# Patient Record
Sex: Female | Born: 1955 | Race: White | Hispanic: No | Marital: Married | Smoking: Never smoker
Health system: Southern US, Community
[De-identification: ages and names within clinical notes are randomized; demographics above are authoritative.]

## PROBLEM LIST (undated history)

## (undated) DIAGNOSIS — M1990 Unspecified osteoarthritis, unspecified site: Secondary | ICD-10-CM

## (undated) DIAGNOSIS — IMO0001 Reserved for inherently not codable concepts without codable children: Secondary | ICD-10-CM

## (undated) DIAGNOSIS — F329 Major depressive disorder, single episode, unspecified: Secondary | ICD-10-CM

## (undated) DIAGNOSIS — T7840XA Allergy, unspecified, initial encounter: Secondary | ICD-10-CM

## (undated) DIAGNOSIS — F32A Depression, unspecified: Secondary | ICD-10-CM

## (undated) DIAGNOSIS — G8929 Other chronic pain: Secondary | ICD-10-CM

## (undated) DIAGNOSIS — G4700 Insomnia, unspecified: Secondary | ICD-10-CM

## (undated) DIAGNOSIS — K219 Gastro-esophageal reflux disease without esophagitis: Secondary | ICD-10-CM

## (undated) DIAGNOSIS — M549 Dorsalgia, unspecified: Secondary | ICD-10-CM

## (undated) HISTORY — DX: Reserved for inherently not codable concepts without codable children: IMO0001

## (undated) HISTORY — DX: Gastro-esophageal reflux disease without esophagitis: K21.9

## (undated) HISTORY — PX: NO PAST SURGERIES: SHX2092

## (undated) HISTORY — DX: Other chronic pain: G89.29

## (undated) HISTORY — DX: Depression, unspecified: F32.A

## (undated) HISTORY — DX: Allergy, unspecified, initial encounter: T78.40XA

## (undated) HISTORY — DX: Unspecified osteoarthritis, unspecified site: M19.90

## (undated) HISTORY — DX: Major depressive disorder, single episode, unspecified: F32.9

## (undated) HISTORY — DX: Insomnia, unspecified: G47.00

## (undated) HISTORY — DX: Other chronic pain: M54.9

---

## 2003-02-10 ENCOUNTER — Other Ambulatory Visit: Admission: RE | Admit: 2003-02-10 | Discharge: 2003-02-10 | Payer: Self-pay | Admitting: Obstetrics and Gynecology

## 2004-09-28 ENCOUNTER — Other Ambulatory Visit: Admission: RE | Admit: 2004-09-28 | Discharge: 2004-09-28 | Payer: Self-pay | Admitting: Obstetrics and Gynecology

## 2006-09-15 ENCOUNTER — Encounter: Admission: RE | Admit: 2006-09-15 | Discharge: 2006-09-15 | Payer: Self-pay

## 2010-11-14 ENCOUNTER — Other Ambulatory Visit: Payer: Self-pay | Admitting: Family Medicine

## 2010-11-15 ENCOUNTER — Ambulatory Visit (HOSPITAL_COMMUNITY)
Admission: RE | Admit: 2010-11-15 | Discharge: 2010-11-15 | Disposition: A | Discharge status: Home / Self Care | Payer: Managed Care, Other (non HMO) | Source: Ambulatory Visit | Attending: Family Medicine | Admitting: Family Medicine

## 2010-11-15 DIAGNOSIS — R109 Unspecified abdominal pain: Secondary | ICD-10-CM | POA: Insufficient documentation

## 2012-06-29 ENCOUNTER — Encounter: Payer: Self-pay | Admitting: *Deleted

## 2012-06-30 ENCOUNTER — Ambulatory Visit: Payer: Self-pay | Admitting: Family Medicine

## 2012-07-01 ENCOUNTER — Other Ambulatory Visit: Payer: Self-pay | Admitting: *Deleted

## 2012-07-01 MED ORDER — SERTRALINE HCL 50 MG PO TABS: 50.0000 mg | ORAL_TABLET | Freq: Every day | ORAL | Status: DC

## 2012-07-01 MED ORDER — BUSPIRONE HCL 10 MG PO TABS: 10.0000 mg | ORAL_TABLET | Freq: Two times a day (BID) | ORAL | Status: DC

## 2012-07-03 ENCOUNTER — Ambulatory Visit: Payer: Managed Care, Other (non HMO) | Admitting: Family Medicine

## 2012-07-09 ENCOUNTER — Ambulatory Visit (INDEPENDENT_AMBULATORY_CARE_PROVIDER_SITE_OTHER): Payer: Managed Care, Other (non HMO) | Admitting: Family Medicine

## 2012-07-09 ENCOUNTER — Encounter: Payer: Self-pay | Admitting: Family Medicine

## 2012-07-09 VITALS — BP 110/68 | HR 70 | Ht 67.0 in | Wt 197.6 lb

## 2012-07-09 DIAGNOSIS — F411 Generalized anxiety disorder: Secondary | ICD-10-CM

## 2012-07-09 DIAGNOSIS — K219 Gastro-esophageal reflux disease without esophagitis: Secondary | ICD-10-CM | POA: Insufficient documentation

## 2012-07-09 DIAGNOSIS — M549 Dorsalgia, unspecified: Secondary | ICD-10-CM | POA: Insufficient documentation

## 2012-07-09 DIAGNOSIS — F329 Major depressive disorder, single episode, unspecified: Secondary | ICD-10-CM

## 2012-07-09 DIAGNOSIS — G4700 Insomnia, unspecified: Secondary | ICD-10-CM

## 2012-07-09 DIAGNOSIS — G8929 Other chronic pain: Secondary | ICD-10-CM

## 2012-07-09 MED ORDER — BUSPIRONE HCL 10 MG PO TABS
10.0000 mg | ORAL_TABLET | Freq: Two times a day (BID) | ORAL | Status: DC
Start: 2012-07-09 — End: 2013-01-27

## 2012-07-09 MED ORDER — SERTRALINE HCL 50 MG PO TABS: 50.0000 mg | ORAL_TABLET | Freq: Every day | ORAL | Status: DC

## 2012-07-09 MED ORDER — PANTOPRAZOLE SODIUM 40 MG PO TBEC: 40.0000 mg | DELAYED_RELEASE_TABLET | Freq: Every day | ORAL | Status: DC

## 2012-07-09 MED ORDER — TRAZODONE HCL 100 MG PO TABS
100.0000 mg | ORAL_TABLET | Freq: Every day | ORAL | Status: DC
Start: 2012-07-09 — End: 2013-05-18

## 2012-07-09 NOTE — Progress Notes (Signed)
  Subjective:    Patient ID: Julie Campos, female    DOB: 1955/12/27, 57 y.o.   MRN: [id number]  Heartburn She complains of abdominal pain and heartburn. This is a recurrent problem. The current episode started more than 1 month ago. The problem has been unchanged. The heartburn duration is several minutes. The heartburn is of moderate intensity. The heartburn does not wake her from sleep. Nothing aggravates the symptoms. She has tried a PPI for the symptoms. The treatment provided moderate relief.   Sleeping better. Taking trazodone. Helping a lot. Exercise these days back to gym. On wwatcher. Started two [employer] Mood overall better this winter. Not as bad as in past. Still trouble sleeping on occasion. Recent dyspnea with anxiety. No chest pain  Review of Systems  Gastrointestinal: Positive for heartburn and abdominal pain.  All other systems reviewed and are negative.  takes two nights per weekfor insomnia. Chronic back pain better.     Objective:   Physical Exam Alert HEENT normal neck supple. Vitals noted. Lungs clear. Heart regular rate and rhythm. Abdomen benign. No significant tenderness.               Impression #1 reflux recent flare discussed. #2 insomnia ongoing medication helps. #3 chronic back pain noted and discuss.       Assessment & Plan:  Impression as noted above. Diet exercise discussed. Maintain same medications. Warning signs discussed. WSL easily 25 minutes spent most in discussion.

## 2012-07-09 NOTE — Patient Instructions (Signed)
Please continue to exercise regularly and watch her diet. Stay with all of the medicines.

## 2012-12-07 ENCOUNTER — Ambulatory Visit (INDEPENDENT_AMBULATORY_CARE_PROVIDER_SITE_OTHER): Payer: Managed Care, Other (non HMO) | Admitting: Family Medicine

## 2012-12-07 ENCOUNTER — Ambulatory Visit: Payer: Managed Care, Other (non HMO) | Admitting: Family Medicine

## 2012-12-07 ENCOUNTER — Encounter: Payer: Self-pay | Admitting: Family Medicine

## 2012-12-07 VITALS — BP 122/78 | Ht 67.0 in | Wt 204.6 lb

## 2012-12-07 DIAGNOSIS — K219 Gastro-esophageal reflux disease without esophagitis: Secondary | ICD-10-CM

## 2012-12-07 DIAGNOSIS — G4700 Insomnia, unspecified: Secondary | ICD-10-CM

## 2012-12-07 MED ORDER — SUCRALFATE 1 G PO TABS: 1.0000 g | ORAL_TABLET | Freq: Four times a day (QID) | ORAL | Status: DC

## 2012-12-07 MED ORDER — PANTOPRAZOLE SODIUM 40 MG PO TBEC: 40.0000 mg | DELAYED_RELEASE_TABLET | Freq: Every day | ORAL | Status: DC

## 2012-12-07 MED ORDER — ONDANSETRON 4 MG PO TBDP: 4.0000 mg | ORAL_TABLET | Freq: Three times a day (TID) | ORAL | Status: DC | PRN

## 2012-12-07 NOTE — Progress Notes (Signed)
  Subjective:    Patient ID: Julie Campos, female    DOB: 1955-06-30, 57 y.o.   MRN: [id number]  HPI Patient arrives with complaint of nausea after eating for a few weeks. Currently on Protonix but not taking it on a regular basis.  Having some ongoing issues with poor sleep, sig stress with aunt in the hospital. Aunt had lung ca, had hx of pulm fibrosis.  Reflux got really bad a few weeks ago, switched to bland foods,   Vomited last night.   Review of Systems No cough no chest pain no major abdominal pain ROS otherwise negative    Objective:   Physical Exam  Alert no acute distress. Lungs clear. Heart regular rate and rhythm. Abdomen slight epigastric tenderness no rebound no guarding no CVA tenderness      Assessment & Plan:  Impression flare of reflux discussed #2 insomnia clinically stable. #3 depression some worsening lately with increased stress. Plan Carafate a.c. and at bedtime. Initiate protonic. Maintain other medications. Diet exercise discussed. Followup in 6 months. WSL

## 2012-12-09 ENCOUNTER — Ambulatory Visit: Payer: Managed Care, Other (non HMO) | Admitting: Family Medicine

## 2013-01-08 ENCOUNTER — Other Ambulatory Visit: Payer: Self-pay | Admitting: Family Medicine

## 2013-01-08 ENCOUNTER — Other Ambulatory Visit: Payer: Self-pay | Admitting: *Deleted

## 2013-01-08 MED ORDER — CLONAZEPAM 0.5 MG PO TABS: 0.5000 mg | ORAL_TABLET | Freq: Every evening | ORAL | Status: DC | PRN

## 2013-01-08 NOTE — Telephone Encounter (Signed)
Ok plus 2 ref 

## 2013-01-22 ENCOUNTER — Telehealth: Payer: Self-pay | Admitting: Family Medicine

## 2013-01-22 ENCOUNTER — Other Ambulatory Visit: Payer: Self-pay | Admitting: Nurse Practitioner

## 2013-01-22 MED ORDER — ONDANSETRON 4 MG PO TBDP: 4.0000 mg | ORAL_TABLET | Freq: Three times a day (TID) | ORAL | Status: DC | PRN

## 2013-01-22 NOTE — Telephone Encounter (Signed)
Patient would like Rx for ondansetron for nausea. She is out of town.   [facility] [place] [phone]

## 2013-01-27 ENCOUNTER — Other Ambulatory Visit: Payer: Self-pay | Admitting: Family Medicine

## 2013-02-24 ENCOUNTER — Other Ambulatory Visit: Payer: Self-pay | Admitting: Family Medicine

## 2013-05-18 ENCOUNTER — Encounter: Payer: Self-pay | Admitting: Family Medicine

## 2013-05-18 ENCOUNTER — Ambulatory Visit (INDEPENDENT_AMBULATORY_CARE_PROVIDER_SITE_OTHER): Payer: BC Managed Care – PPO | Admitting: Family Medicine

## 2013-05-18 VITALS — BP 114/76 | Ht 66.0 in | Wt 214.0 lb

## 2013-05-18 DIAGNOSIS — F329 Major depressive disorder, single episode, unspecified: Secondary | ICD-10-CM

## 2013-05-18 DIAGNOSIS — M549 Dorsalgia, unspecified: Secondary | ICD-10-CM

## 2013-05-18 DIAGNOSIS — F32A Depression, unspecified: Secondary | ICD-10-CM

## 2013-05-18 DIAGNOSIS — K219 Gastro-esophageal reflux disease without esophagitis: Secondary | ICD-10-CM

## 2013-05-18 DIAGNOSIS — G4700 Insomnia, unspecified: Secondary | ICD-10-CM

## 2013-05-18 DIAGNOSIS — G8929 Other chronic pain: Secondary | ICD-10-CM

## 2013-05-18 DIAGNOSIS — F3289 Other specified depressive episodes: Secondary | ICD-10-CM

## 2013-05-18 MED ORDER — BUSPIRONE HCL 10 MG PO TABS: ORAL_TABLET | ORAL | Status: DC

## 2013-05-18 MED ORDER — PANTOPRAZOLE SODIUM 40 MG PO TBEC: 40.0000 mg | DELAYED_RELEASE_TABLET | Freq: Every day | ORAL | Status: DC

## 2013-05-18 MED ORDER — CLONAZEPAM 0.5 MG PO TABS: 0.5000 mg | ORAL_TABLET | Freq: Every evening | ORAL | Status: DC | PRN

## 2013-05-18 MED ORDER — TRAZODONE HCL 100 MG PO TABS: 100.0000 mg | ORAL_TABLET | Freq: Every day | ORAL | Status: DC

## 2013-05-18 MED ORDER — SERTRALINE HCL 50 MG PO TABS: ORAL_TABLET | ORAL | Status: DC

## 2013-05-18 MED ORDER — ONDANSETRON 4 MG PO TBDP: 4.0000 mg | ORAL_TABLET | Freq: Four times a day (QID) | ORAL | Status: DC | PRN

## 2013-05-18 NOTE — Progress Notes (Signed)
   Subjective:    Patient ID: Julie Campos, female    DOB: 1955/05/20, 58 y.o.   MRN: [id number]  HPI Patient is here today for a medication check up.   Staying busy with Julie Campos  hearburn itself better. Some nausea hits two or three times per wk. No certain foods. Can last a couple of hours. Hard to take on empty stomach with the protonix  She states that she is still having trouble with nausea at times. No vomiting noted.   Overall mood better, no sig problem  Had a chalenge with anxiety. Ongoing challenge also with insomnia. Review of Systems No headache no chest pain no back pain no change in bowel habits no blood in stool    Objective:   Physical Exam Alert mild malaise. HEENT normal. Lungs clear. Heart regular in rhythm. Abdomen no particular tenderness. No rebound no guarding       Assessment & Plan:  Impression 1 reflux with possible element of gastritis. Also significant nausea #2 insomnia ongoing. #3 depression and anxiety ongoing but clinically stable plan add Zofran once again.Marland Kitchen. Resume protonic for regularly. Diet exercise discussed in encourage. Maintain other medications. Recheck as scheduled. WSL

## 2013-05-24 ENCOUNTER — Other Ambulatory Visit: Payer: Self-pay | Admitting: Family Medicine

## 2013-08-03 ENCOUNTER — Ambulatory Visit: Payer: BC Managed Care – PPO | Admitting: Family Medicine

## 2013-08-09 ENCOUNTER — Encounter: Payer: Self-pay | Admitting: Nurse Practitioner

## 2013-08-09 ENCOUNTER — Ambulatory Visit (INDEPENDENT_AMBULATORY_CARE_PROVIDER_SITE_OTHER): Payer: BC Managed Care – PPO | Admitting: Nurse Practitioner

## 2013-08-09 VITALS — BP 128/82 | Ht 66.0 in | Wt 218.2 lb

## 2013-08-09 DIAGNOSIS — M545 Low back pain, unspecified: Secondary | ICD-10-CM

## 2013-08-09 MED ORDER — METHOCARBAMOL 750 MG PO TABS: 750.0000 mg | ORAL_TABLET | Freq: Three times a day (TID) | ORAL | Status: DC | PRN

## 2013-08-09 MED ORDER — PREDNISONE 20 MG PO TABS: ORAL_TABLET | ORAL | Status: DC

## 2013-08-09 MED ORDER — HYDROCODONE-ACETAMINOPHEN 5-325 MG PO TABS: 1.0000 | ORAL_TABLET | ORAL | Status: DC | PRN

## 2013-08-09 NOTE — Patient Instructions (Signed)
Ice or heat applications Restart TENS unit Restart lidocaine patches

## 2013-08-11 ENCOUNTER — Encounter: Payer: Self-pay | Admitting: Nurse Practitioner

## 2013-08-11 NOTE — Progress Notes (Signed)
Subjective:  Presents for c/o low back pain going into right thigh. Began after getting out of the shower. Felt a "catch" in her back. No change in bowel or bladder habits. History of "bulging disc". No weakness or numbness of right leg. Has lidocaine patches at home which have helped in the past; has not tried them. Has gained some weight. Also has a history of chronic insomnia which has added to her fatigue.  Objective:   BP 128/82  Ht 5\' 6"  (1.676 m)  Wt 218 lb 3.2 oz (98.975 kg)  BMI 35.24 kg/m2 NAD. Alert, oriented. Lungs clear. Heart RRR. Mild tenderness and muscle tightness noted in right low back area. SLR neg bilat. Reflexes nl. Gait slow but steady.  Assessment: Low back pain radiating to right leg  Plan:  Meds ordered this encounter  Medications  . lidocaine (LIDODERM) 5 %    Sig:   . predniSONE (DELTASONE) 20 MG tablet    Sig: 3 po qd x 3 d then 2 po qd x 3 d then 1 po qd x 3 d    Dispense:  18 tablet    Refill:  0    Order Specific Question:  Supervising Provider    Answer:  Merlyn AlbertLUKING, WILLIAM S [[id number]]  . methocarbamol (ROBAXIN) 750 MG tablet    Sig: Take 1 tablet (750 mg total) by mouth every 8 (eight) hours as needed for muscle spasms.    Dispense:  30 tablet    Refill:  0    Order Specific Question:  Supervising Provider    Answer:  Merlyn AlbertLUKING, WILLIAM S [[id number]]  . HYDROcodone-acetaminophen (NORCO/VICODIN) 5-325 MG per tablet    Sig: Take 1 tablet by mouth every 4 (four) hours as needed.    Dispense:  30 tablet    Refill:  0    Order Specific Question:  Supervising Provider    Answer:  Merlyn AlbertLUKING, WILLIAM S [[id number]]   Ice or heat applications Restart TENS unit Restart lidocaine patches Back exercises. Call back in 7-10 days if no improvement, sooner if worse.

## 2013-09-14 ENCOUNTER — Other Ambulatory Visit: Payer: Self-pay | Admitting: Family Medicine

## 2013-12-15 ENCOUNTER — Other Ambulatory Visit: Payer: Self-pay | Admitting: Family Medicine

## 2013-12-16 NOTE — Telephone Encounter (Signed)
Ok times one contact pt due six mo chronic prob visit

## 2014-01-14 ENCOUNTER — Other Ambulatory Visit: Payer: Self-pay | Admitting: Family Medicine

## 2014-04-20 ENCOUNTER — Ambulatory Visit (INDEPENDENT_AMBULATORY_CARE_PROVIDER_SITE_OTHER): Payer: BLUE CROSS/BLUE SHIELD | Admitting: Family Medicine

## 2014-04-20 ENCOUNTER — Encounter: Payer: Self-pay | Admitting: Family Medicine

## 2014-04-20 VITALS — Ht 65.0 in | Wt 210.8 lb

## 2014-04-20 DIAGNOSIS — R1011 Right upper quadrant pain: Secondary | ICD-10-CM

## 2014-04-20 MED ORDER — BUSPIRONE HCL 10 MG PO TABS: 10.0000 mg | ORAL_TABLET | Freq: Two times a day (BID) | ORAL | Status: DC

## 2014-04-20 MED ORDER — SERTRALINE HCL 50 MG PO TABS: 50.0000 mg | ORAL_TABLET | Freq: Every day | ORAL | Status: DC

## 2014-04-20 MED ORDER — PANTOPRAZOLE SODIUM 40 MG PO TBEC: 40.0000 mg | DELAYED_RELEASE_TABLET | Freq: Every day | ORAL | Status: DC

## 2014-04-20 MED ORDER — CLONAZEPAM 0.5 MG PO TABS: ORAL_TABLET | ORAL | Status: DC

## 2014-04-20 MED ORDER — SUCRALFATE 1 G PO TABS: 1.0000 g | ORAL_TABLET | Freq: Four times a day (QID) | ORAL | Status: DC

## 2014-04-20 NOTE — Progress Notes (Signed)
   Subjective:    Patient ID: Julie Campos, female    DOB: 09-Jul-1955, 59 y.o.   MRN: [id number]  HPI Patient arrives with problems with upset stomach and heartburn for few weeks-will vomit several times a week.  Uses the protonix more as needed   Still has gallbladder  Morning nausea not a problem  Steaks etc. Often trigger nause and then vomiting  Received bad news regarding loved one last wk and developed nausea  Gets reflux four to five times per wk  Patient arrives office with numerous concerns. History of depression. Clinically stable on medications.  Also history of substantial insomnia. States definitely needs medication to help her in this regard.  Continues to have substantial nausea. Often postprandial. Also reflux is a great challenge. On further history not taking the Protonix regularly. Has backed off to over-the-counter ranitidine.   Review of Systems No chest pain no headache no shortness of breath some upper abdominal pain definite reflux symptoms no chills. No true dysphagia    Objective:   Physical Exam Alert no acute distress. Vitals stable. Lungs clear. Heart regular in rhythm H&T normal. Epigastrium mild tenderness to palpation. No rebound no guarding no significant right upper quadrant tenderness       Assessment & Plan:  Impression 1 insomnia stable on meds #2 depression clinically stable. #3 reflux. #4 probable element of gastritis. #5 intermittent substantial nausea plan right upper quadrant ultrasound. Rationale discussed. Maintain other medications. Resume Protonix. Add Carafate. Warning signs discussed. Follow-up as scheduled. WSL

## 2014-04-22 ENCOUNTER — Ambulatory Visit (HOSPITAL_COMMUNITY)
Admission: RE | Admit: 2014-04-22 | Discharge: 2014-04-22 | Disposition: A | Discharge status: Home / Self Care | Payer: BLUE CROSS/BLUE SHIELD | Source: Ambulatory Visit | Attending: Family Medicine | Admitting: Family Medicine

## 2014-04-22 DIAGNOSIS — R1011 Right upper quadrant pain: Secondary | ICD-10-CM | POA: Diagnosis not present

## 2014-04-22 DIAGNOSIS — R110 Nausea: Secondary | ICD-10-CM | POA: Diagnosis not present

## 2014-04-27 ENCOUNTER — Telehealth: Payer: Self-pay | Admitting: Family Medicine

## 2014-04-27 MED ORDER — ONDANSETRON 8 MG PO TBDP: 8.0000 mg | ORAL_TABLET | Freq: Three times a day (TID) | ORAL | Status: DC | PRN

## 2014-04-27 NOTE — Telephone Encounter (Signed)
Per Dr. Lorin PicketScott- Notified patient that we will send in Zofran 8 mg ODT #30 1 tab po TID PRN. Acid reflux medications can take up to 2 weeks to see results, so continue to take omeprazole. If no better in 2 weeks, follow up with Dr. Brett CanalesSteve for further testing. Patient verbalized understanding.

## 2014-04-27 NOTE — Telephone Encounter (Signed)
Pt calling to say that she would like some nausea meds to take due to her  Still having nausea with her heartburn. She had a gallbladder done last Friday That came back negative so she thinks it is just reflux at this point. Is there anything  else she can take a this time for the reflux?   She is not having any lower abd pain with this its all in her upper and esophagus at this point  [facility] [place]

## 2014-05-07 ENCOUNTER — Other Ambulatory Visit: Payer: Self-pay | Admitting: Family Medicine

## 2014-06-06 ENCOUNTER — Other Ambulatory Visit: Payer: Self-pay | Admitting: Family Medicine

## 2014-06-30 ENCOUNTER — Other Ambulatory Visit: Payer: Self-pay | Admitting: Family Medicine

## 2014-08-08 ENCOUNTER — Ambulatory Visit (INDEPENDENT_AMBULATORY_CARE_PROVIDER_SITE_OTHER): Payer: BLUE CROSS/BLUE SHIELD | Admitting: Family Medicine

## 2014-08-08 ENCOUNTER — Encounter: Payer: Self-pay | Admitting: Family Medicine

## 2014-08-08 ENCOUNTER — Ambulatory Visit (HOSPITAL_COMMUNITY)
Admission: RE | Admit: 2014-08-08 | Discharge: 2014-08-08 | Disposition: A | Discharge status: Home / Self Care | Payer: BLUE CROSS/BLUE SHIELD | Source: Ambulatory Visit | Attending: Family Medicine | Admitting: Family Medicine

## 2014-08-08 VITALS — BP 122/82 | Ht 65.0 in | Wt 215.5 lb

## 2014-08-08 DIAGNOSIS — R05 Cough: Secondary | ICD-10-CM | POA: Diagnosis present

## 2014-08-08 DIAGNOSIS — R5383 Other fatigue: Secondary | ICD-10-CM | POA: Diagnosis not present

## 2014-08-08 DIAGNOSIS — Z79899 Other long term (current) drug therapy: Secondary | ICD-10-CM

## 2014-08-08 DIAGNOSIS — K219 Gastro-esophageal reflux disease without esophagitis: Secondary | ICD-10-CM | POA: Diagnosis not present

## 2014-08-08 DIAGNOSIS — E785 Hyperlipidemia, unspecified: Secondary | ICD-10-CM

## 2014-08-08 DIAGNOSIS — R059 Cough, unspecified: Secondary | ICD-10-CM

## 2014-08-08 LAB — POCT HEMOGLOBIN: HEMOGLOBIN: 13.3 g/dL (ref 12.2–16.2)

## 2014-08-08 MED ORDER — AZITHROMYCIN 250 MG PO TABS: ORAL_TABLET | ORAL | Status: DC

## 2014-08-08 NOTE — Addendum Note (Signed)
Addended by: Margaretha SheffieldBROWN, Raykwon Hobbs S on: 08/08/2014 06:12 PM   Modules accepted: Orders

## 2014-08-08 NOTE — Progress Notes (Signed)
   Subjective:    Patient ID: Julie Campos, female    DOB: 1955/09/08, 59 y.o.   MRN: [id number]  HPI  Patient arrives with complaint of fatigue- went to give blood recently and was told iron was low. Patient started otc iron tablets.  Results for orders placed or performed in visit on 08/08/14  POCT hemoglobin  Result Value Ref Range   Hemoglobin 13.3 12.2 - 16.2 g/dL  taking two multivitr with iron  Separate tablet.'  Sticking with iron tabs daily  No energy   Legs feel pretty weak, tiredness Tiredness has come on for six weeks or so   Two and three d per wk exrcise lliptical and ex bike and weights  Then hurt the shoulder, less often with gym  Pt's cousing pulm fibrosis, waiting on a lung transplant  Working six to eight hours per wk ish   Stomach overall doing well  Overall depr is doing good. Compliant with medications.   Overall reflux is improved. Compliant with Protonix. Recently stopped the Carafate.  Review of Systems    no headache no chest pain no back pain subacute cough past 6 weeks. This raises patient's anxiety regarding pulmonary fibrosis in the family no change in bowel habits Objective:   Physical Exam  Alert vital stable HEENT slight nasal congestion frontal neck supple lungs clear heart regular in rhythm thyroid nonpalpable  Hemoglobin decent      Assessment & Plan:  Impression fatigue protracted etiology unclear #2 reflux clinically improved #3 depression clinically stable #4 subacute cough with worry about pulmonary fibrosis plan appropriate blood work. Diet and exercise discussed and strongly encourage. Maintain same medications. Try to wean off protonic so one point. Further recommendations based on blood work [facility]SL

## 2014-08-09 LAB — CBC WITH DIFFERENTIAL/PLATELET
BASOS: 1 %
Basophils Absolute: 0 10*3/uL (ref 0.0–0.2)
EOS (ABSOLUTE): 0.1 10*3/uL (ref 0.0–0.4)
Eos: 3 %
HEMATOCRIT: 39.7 % (ref 34.0–46.6)
Hemoglobin: 13.3 g/dL (ref 11.1–15.9)
IMMATURE GRANULOCYTES: 0 %
Immature Grans (Abs): 0 10*3/uL (ref 0.0–0.1)
Lymphocytes Absolute: 1.8 10*3/uL (ref 0.7–3.1)
Lymphs: 34 %
MCH: 31 pg (ref 26.6–33.0)
MCHC: 33.5 g/dL (ref 31.5–35.7)
MCV: 93 fL (ref 79–97)
MONOCYTES: 9 %
Monocytes Absolute: 0.5 10*3/uL (ref 0.1–0.9)
NEUTROS PCT: 53 %
Neutrophils Absolute: 2.8 10*3/uL (ref 1.4–7.0)
Platelets: 336 10*3/uL (ref 150–379)
RBC: 4.29 x10E6/uL (ref 3.77–5.28)
RDW: 13.1 % (ref 12.3–15.4)
WBC: 5.2 10*3/uL (ref 3.4–10.8)

## 2014-08-09 LAB — BASIC METABOLIC PANEL
BUN/Creatinine Ratio: 15 (ref 9–23)
BUN: 11 mg/dL (ref 6–24)
CALCIUM: 9.3 mg/dL (ref 8.7–10.2)
CO2: 24 mmol/L (ref 18–29)
Chloride: 100 mmol/L (ref 97–108)
Creatinine, Ser: 0.72 mg/dL (ref 0.57–1.00)
GFR, EST AFRICAN AMERICAN: 108 mL/min/{1.73_m2} (ref >59)
GFR, EST NON AFRICAN AMERICAN: 93 mL/min/{1.73_m2} (ref >59)
Glucose: 94 mg/dL (ref 65–99)
Potassium: 4.4 mmol/L (ref 3.5–5.2)
SODIUM: 142 mmol/L (ref 134–144)

## 2014-08-09 LAB — LIPID PANEL
CHOLESTEROL TOTAL: 219 mg/dL — AB (ref 100–199)
Chol/HDL Ratio: 4.4 ratio units (ref 0.0–4.4)
HDL: 50 mg/dL (ref >39)
LDL Calculated: 152 mg/dL — ABNORMAL HIGH (ref 0–99)
Triglycerides: 87 mg/dL (ref 0–149)
VLDL Cholesterol Cal: 17 mg/dL (ref 5–40)

## 2014-08-09 LAB — TSH: TSH: 4.3 u[IU]/mL (ref 0.450–4.500)

## 2014-08-09 LAB — HEPATIC FUNCTION PANEL
ALBUMIN: 4.2 g/dL (ref 3.5–5.5)
ALT: 19 IU/L (ref 0–32)
AST: 21 IU/L (ref 0–40)
Alkaline Phosphatase: 110 IU/L (ref 39–117)
BILIRUBIN TOTAL: 0.4 mg/dL (ref 0.0–1.2)
BILIRUBIN, DIRECT: 0.09 mg/dL (ref 0.00–0.40)
Total Protein: 6.9 g/dL (ref 6.0–8.5)

## 2014-08-12 ENCOUNTER — Telehealth: Payer: Self-pay | Admitting: Family Medicine

## 2014-08-12 ENCOUNTER — Other Ambulatory Visit: Payer: Self-pay | Admitting: *Deleted

## 2014-08-12 MED ORDER — CEFDINIR 300 MG PO CAPS: 300.0000 mg | ORAL_CAPSULE | Freq: Two times a day (BID) | ORAL | Status: DC

## 2014-08-12 NOTE — Telephone Encounter (Signed)
Pt is asking for her lab results, she also wants another round  Of antibiotics says she is not better but just finished her zpak today Still feeling really tired as well   She also wants to know if allegra instead of the claritin you suggest  Is just as good?    [facility] [place] [address]

## 2014-08-12 NOTE — Telephone Encounter (Signed)
See lab results, can use the allegra that is fine

## 2014-08-12 NOTE — Telephone Encounter (Signed)
Discussed with pt

## 2014-08-12 NOTE — Telephone Encounter (Signed)
Seen 4/25 finished zpack. No wheezing but having to take deep breaths, no fever, fatigue, legs feels tired and weak, coughing up clear mucus. Taking allerga but can't remember if you told her to take clairtin instead. Also wanting results of bw.

## 2014-09-05 ENCOUNTER — Other Ambulatory Visit: Payer: Self-pay | Admitting: Family Medicine

## 2014-09-05 MED ORDER — CEFDINIR 300 MG PO CAPS: 300.0000 mg | ORAL_CAPSULE | Freq: Two times a day (BID) | ORAL | Status: DC

## 2014-09-05 NOTE — Telephone Encounter (Signed)
Med sent to pharmacy. Left message on voicemail notifying patient.  

## 2014-09-05 NOTE — Telephone Encounter (Signed)
Ok times one 

## 2014-09-05 NOTE — Telephone Encounter (Signed)
Patient needs a refill on omnicef 300 mg capsules having flare- up with coughing,congestion cant sleep at night due to coughing so much. [facility]  [place] [address]

## 2014-09-06 ENCOUNTER — Other Ambulatory Visit: Payer: Self-pay | Admitting: Family Medicine

## 2014-09-13 ENCOUNTER — Telehealth: Payer: Self-pay | Admitting: Family Medicine

## 2014-09-13 DIAGNOSIS — R059 Cough, unspecified: Secondary | ICD-10-CM

## 2014-09-13 DIAGNOSIS — R05 Cough: Secondary | ICD-10-CM

## 2014-09-13 DIAGNOSIS — R9389 Abnormal findings on diagnostic imaging of other specified body structures: Secondary | ICD-10-CM

## 2014-09-13 NOTE — Telephone Encounter (Signed)
Pt called stating that her bronchitis isn't getting any better and wants to know if it is normal or if she needs a different medication called in.   [facility] [place] [address]

## 2014-09-13 NOTE — Telephone Encounter (Signed)
Given z pack 08/08/2014

## 2014-09-13 NOTE — Telephone Encounter (Signed)
omnicef 300 bid tendays, rec to do ch xray when this is finished (originally recommended four wks ago to do around now)

## 2014-09-14 ENCOUNTER — Ambulatory Visit (HOSPITAL_COMMUNITY)
Admission: RE | Admit: 2014-09-14 | Discharge: 2014-09-14 | Disposition: A | Discharge status: Home / Self Care | Payer: BLUE CROSS/BLUE SHIELD | Source: Ambulatory Visit | Attending: Family Medicine | Admitting: Family Medicine

## 2014-09-14 DIAGNOSIS — R0602 Shortness of breath: Secondary | ICD-10-CM | POA: Diagnosis not present

## 2014-09-14 DIAGNOSIS — R079 Chest pain, unspecified: Secondary | ICD-10-CM | POA: Diagnosis not present

## 2014-09-14 DIAGNOSIS — R05 Cough: Secondary | ICD-10-CM | POA: Diagnosis present

## 2014-09-14 MED ORDER — CEFDINIR 300 MG PO CAPS: 300.0000 mg | ORAL_CAPSULE | Freq: Two times a day (BID) | ORAL | Status: DC

## 2014-09-14 NOTE — Telephone Encounter (Signed)
Rx sent electronically to pharmacy. CXR ordered in EPIC. Discussed with patient. Patient verbalized understanding.

## 2014-09-16 ENCOUNTER — Telehealth: Payer: Self-pay | Admitting: Family Medicine

## 2014-09-16 NOTE — Telephone Encounter (Signed)
Chest xray stable; no problems noted with heart or lungs

## 2014-09-16 NOTE — Telephone Encounter (Signed)
Results discussed with patient. Patient advised Chest xray stable; no problems noted with heart or lungs. Patient verbalized understanding.

## 2014-09-16 NOTE — Telephone Encounter (Signed)
Pt called wanting chest x-ray results, tried to explain that the results were sent to Dr. Brett CanalesSteve and that he has not signed off on them and that he would be back in the office on Monday.  Pt very anxious and worried, almost to tears, would like a call from us regarding the results as soon as possible, please do not mail a card.

## 2014-09-19 NOTE — Addendum Note (Signed)
Addended by: Metro KungICHARDS, WENDY M on: 09/19/2014 04:32 PM   Modules accepted: Orders

## 2014-09-21 ENCOUNTER — Encounter: Payer: Self-pay | Admitting: Internal Medicine

## 2014-09-21 ENCOUNTER — Encounter (INDEPENDENT_AMBULATORY_CARE_PROVIDER_SITE_OTHER): Payer: Self-pay

## 2014-09-21 ENCOUNTER — Ambulatory Visit (INDEPENDENT_AMBULATORY_CARE_PROVIDER_SITE_OTHER): Payer: BLUE CROSS/BLUE SHIELD | Admitting: Internal Medicine

## 2014-09-21 VITALS — BP 118/80 | HR 87 | Ht 66.0 in | Wt 217.4 lb

## 2014-09-21 DIAGNOSIS — R0600 Dyspnea, unspecified: Secondary | ICD-10-CM | POA: Diagnosis not present

## 2014-09-21 DIAGNOSIS — J8410 Pulmonary fibrosis, unspecified: Secondary | ICD-10-CM

## 2014-09-21 DIAGNOSIS — R9389 Abnormal findings on diagnostic imaging of other specified body structures: Secondary | ICD-10-CM | POA: Insufficient documentation

## 2014-09-21 MED ORDER — PANTOPRAZOLE SODIUM 40 MG PO TBEC: 40.0000 mg | DELAYED_RELEASE_TABLET | Freq: Every day | ORAL | Status: DC

## 2014-09-21 MED ORDER — FAMOTIDINE 20 MG PO TABS: ORAL_TABLET | ORAL | Status: DC

## 2014-09-21 NOTE — Patient Instructions (Addendum)
Pantoprazole (protonix) 40 mg   Take  30-60 min before first meal of the day and Pepcid (famotidine)  20 mg one @  bedtime until return to office - this is the best way to tell whether stomach acid is contributing to your problem.    GERD (REFLUX)  is an extremely common cause of respiratory symptoms just like yours , many times with no obvious heartburn at all.    It can be treated with medication, but also with lifestyle changes including avoidance of late meals, elevation of the head of your bed (ideally with 6 inch  bed blocks) excessive alcohol, smoking cessation, and avoid fatty foods, chocolate, peppermint, colas, red wine, and acidic juices such as orange juice.  NO MINT OR MENTHOL PRODUCTS SO NO COUGH DROPS  USE SUGARLESS CANDY INSTEAD (Jolley ranchers or Stover's or Life Savers) or even ice chips will also do - the key is to swallow to prevent all throat clearing. NO OIL BASED VITAMINS - use powdered substitutes.  Please schedule a follow up office visit in 6 weeks, call sooner if needed pfts  Add HRCT rec due to desats

## 2014-09-21 NOTE — Assessment & Plan Note (Addendum)
-    09/21/2014  Walked RA x 1 laps @ 185 ft each stopped due to 77% at nl pace and no sob   desat with increase int markings in pt with fm hx of PF > most likely this if PF ? Post inflammatory vs early UIP - see PF

## 2014-09-21 NOTE — Assessment & Plan Note (Signed)
DDx for pulmonary fibrosis  includes idiopathic pulmonary fibrosis, pulmonary fibrosis associated with rheumatologic diseases (which have a relatively benign course in most cases) , adverse effect from  drugs such as chemotherapy or amiodarone exposure, nonspecific interstitial pneumonia which is typically steroid responsive, and chronic hypersensitivity pneumonitis.   In active  smokers Langerhan's Cell  Histiocyctosis (eosinophilic granuomatosis),  DIP,  and Respiratory Bronchiolitis ILD also need to be considered,    HRCT ordered/ will get pfts and collagen vasc profile on return  Use of PPI is associated with improved survival time and with decreased radiologic fibrosis per King's study published in AJRCCM vol 184 p1390.  Dec 2011  This may not be cause and effect, but given how universally unimpressive and expensive  all the other  Drugs developed to day  have been for pf,   rec start  rx ppi / diet/ lifestyle modification and f/u with serial walking sats and lung volumes for now to put more points on the curve / establish firm baseline before considering additional measures.

## 2014-09-21 NOTE — Progress Notes (Signed)
Subjective:    Patient ID: Julie Campos, female    DOB: 1955-08-23,    MRN: [id number]  HPI  70 yowf never smoker with pattern of springtime runny nose /sneeze/ controlled with otc's since 20s similar problem March 2016 but this time with cough onset in April 2016 and referred to pulmonary clinic for 09/21/2014 by Dr Gerda Diss with ? ILD    09/21/2014 1st [facility] office visit/ [facility]   Chief Complaint  Patient presents with  . Pulmonary Consult    Referred by Dr. Lilyan Punt for eval of abnormal cxr.  Pt c/o cough and SOB since April 2016. She states occurs at rest off and on.  Her cough is mainly non prod, but occ produces min clear sputum.   did have fever back in April 2016 no h/o uti's/ no use of macrodantin/ no chemo/ no collagen vasc dz  Was on protonix but d/c 2 weeks prior to OV  And now overt hb/ concern re long term renal effects noted. But so far not adverse effect Father had pf and cousin on father's side and fathers sister.  Overall better since uri and some yard work but not very active since illness / some residual nasal congestion.   No obvious other patterns in day to day or daytime variabilty or assoc cp or chest tightness, subjective wheeze . No unusual exp hx or h/o childhood pna/ asthma or knowledge of premature birth.  Sleeping ok without nocturnal  or early am exacerbation  of respiratory  c/o's or need for noct saba. Also denies any obvious fluctuation of symptoms with weather or environmental changes or other aggravating or alleviating factors except as outlined above   Current Medications, Allergies, Complete Past Medical History, Past Surgical History, Family History, and Social History were reviewed in [employer] record.            Review of Systems  Constitutional: Negative for fever, chills and unexpected weight change.  HENT: Positive for congestion. Negative for dental problem, ear pain, nosebleeds, postnasal drip,  rhinorrhea, sinus pressure, sneezing, sore throat, trouble swallowing and voice change.   Eyes: Negative for visual disturbance.  Respiratory: Positive for cough and shortness of breath. Negative for choking.   Cardiovascular: Negative for chest pain and leg swelling.  Gastrointestinal: Negative for vomiting, abdominal pain and diarrhea.  Genitourinary: Negative for difficulty urinating.       Acid heartburn  Musculoskeletal: Negative for arthralgias.  Skin: Negative for rash.  Neurological: Negative for tremors, syncope and headaches.  Hematological: Does not bruise/bleed easily.       Objective:   Physical Exam  amb wf nad  Wt Readings from Last 3 Encounters:  09/21/14 217 lb 6.4 oz (98.612 kg)  08/08/14 215 lb 8 oz (97.75 kg)  04/20/14 210 lb 12.8 oz (95.618 kg)    Vital signs reviewed   HEENT: nl dentition, turbinates, and orophanx. Nl external ear canals without cough reflex   NECK :  without JVD/Nodes/TM/ nl carotid upstrokes bilaterally   LUNGS: no acc muscle use, slt decreased air movement in bases but no adventitial breath sounds bilaterally   CV:  RRR  no s3 or murmur or increase in P2, no edema   ABD:  soft and nontender with nl excursion in the supine position. No bruits or organomegaly, bowel sounds nl  MS:  warm without deformities, calf tenderness, cyanosis or clubbing  SKIN: warm and dry without lesions    NEURO:  alert,  approp, no deficits     I personally reviewed images and agree with radiology impression as follows:  CXR:  09/15/14 Again noted are coarse lung markings which appear unchanged. There is no focal airspace disease or edema. Heart and mediastinum are within normal limits. The trachea is midline. Mild degenerative changes in the thoracic spine. No large pleural effusions.             Assessment & Plan:

## 2014-09-22 ENCOUNTER — Ambulatory Visit (INDEPENDENT_AMBULATORY_CARE_PROVIDER_SITE_OTHER)
Admission: RE | Admit: 2014-09-22 | Discharge: 2014-09-22 | Disposition: A | Discharge status: Home / Self Care | Payer: BLUE CROSS/BLUE SHIELD | Source: Ambulatory Visit | Attending: Internal Medicine | Admitting: Internal Medicine

## 2014-09-22 DIAGNOSIS — R0600 Dyspnea, unspecified: Secondary | ICD-10-CM

## 2014-09-22 DIAGNOSIS — J8410 Pulmonary fibrosis, unspecified: Secondary | ICD-10-CM

## 2014-09-23 NOTE — Progress Notes (Signed)
Quick Note:  ATC, NA and no option to leave a msg ______ 

## 2014-09-26 ENCOUNTER — Inpatient Hospital Stay: Admission: RE | Admit: 2014-09-26 | Payer: BLUE CROSS/BLUE SHIELD | Source: Ambulatory Visit

## 2014-09-27 ENCOUNTER — Telehealth: Payer: Self-pay | Admitting: Internal Medicine

## 2014-09-27 NOTE — Telephone Encounter (Signed)
LMTCB x 1  Notes Recorded by Nyoka Cowden, MD on 09/22/2014 at 7:39 PM Call patient : Study is unremarkable, there are some tiny nodules we can discuss at next ov but these are common and likely nothing an there is no pf

## 2014-09-28 NOTE — Telephone Encounter (Signed)
If feeling better yes but don't overdo it - reconditioning usually takes at least 2 weeks

## 2014-09-28 NOTE — Telephone Encounter (Signed)
Spoke with pt. When she was here on 09/21/14, MW advised her not to exercise due to her sats falling while do a walk test. She wants to know if it would safe for her to be able to go back to the gym now.  MW - please advise. Thanks.

## 2014-09-28 NOTE — Telephone Encounter (Signed)
Called made pt aware. Nothing further needed 

## 2014-10-26 ENCOUNTER — Other Ambulatory Visit: Payer: Self-pay | Admitting: Family Medicine

## 2014-10-27 NOTE — Telephone Encounter (Signed)
Ok 6 mo worth 

## 2014-10-31 ENCOUNTER — Encounter: Payer: Self-pay | Admitting: Nurse Practitioner

## 2014-10-31 ENCOUNTER — Ambulatory Visit (INDEPENDENT_AMBULATORY_CARE_PROVIDER_SITE_OTHER): Payer: BLUE CROSS/BLUE SHIELD | Admitting: Nurse Practitioner

## 2014-10-31 VITALS — BP 128/82 | Ht 67.0 in | Wt 219.0 lb

## 2014-10-31 DIAGNOSIS — M25512 Pain in left shoulder: Secondary | ICD-10-CM

## 2014-10-31 DIAGNOSIS — F411 Generalized anxiety disorder: Secondary | ICD-10-CM | POA: Diagnosis not present

## 2014-10-31 DIAGNOSIS — F329 Major depressive disorder, single episode, unspecified: Secondary | ICD-10-CM | POA: Diagnosis not present

## 2014-10-31 DIAGNOSIS — M7522 Bicipital tendinitis, left shoulder: Secondary | ICD-10-CM | POA: Diagnosis not present

## 2014-10-31 DIAGNOSIS — F32A Depression, unspecified: Secondary | ICD-10-CM

## 2014-10-31 MED ORDER — METHOCARBAMOL 750 MG PO TABS: 750.0000 mg | ORAL_TABLET | Freq: Three times a day (TID) | ORAL | Status: DC | PRN

## 2014-10-31 MED ORDER — MELOXICAM 15 MG PO TABS: 15.0000 mg | ORAL_TABLET | Freq: Every day | ORAL | Status: DC

## 2014-11-01 ENCOUNTER — Encounter: Payer: Self-pay | Admitting: Nurse Practitioner
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
## 2014-11-11 ENCOUNTER — Ambulatory Visit: Payer: BLUE CROSS/BLUE SHIELD | Admitting: Internal Medicine

## 2014-11-17 ENCOUNTER — Ambulatory Visit (INDEPENDENT_AMBULATORY_CARE_PROVIDER_SITE_OTHER): Payer: BLUE CROSS/BLUE SHIELD | Admitting: Internal Medicine

## 2014-11-17 ENCOUNTER — Telehealth: Payer: Self-pay | Admitting: Nurse Practitioner

## 2014-11-17 ENCOUNTER — Other Ambulatory Visit: Payer: Self-pay | Admitting: Nurse Practitioner

## 2014-11-17 DIAGNOSIS — R0600 Dyspnea, unspecified: Secondary | ICD-10-CM

## 2014-11-17 LAB — PULMONARY FUNCTION TEST
DL/VA % pred: 95 %
DL/VA: 4.72 ml/min/mmHg/L
DLCO UNC: 20.83 ml/min/mmHg
DLCO unc % pred: 81 %
FEF 25-75 Post: 2.89 L/sec
FEF 25-75 Pre: 2.42 L/sec
FEF2575-%CHANGE-POST: 19 %
FEF2575-%PRED-POST: 115 %
FEF2575-%PRED-PRE: 96 %
FEV1-%Change-Post: 4 %
FEV1-%Pred-Post: 86 %
FEV1-%Pred-Pre: 83 %
FEV1-POST: 2.35 L
FEV1-Pre: 2.26 L
FEV1FVC-%CHANGE-POST: 4 %
FEV1FVC-%PRED-PRE: 102 %
FEV6-%Change-Post: 0 %
FEV6-%PRED-PRE: 82 %
FEV6-%Pred-Post: 82 %
FEV6-PRE: 2.78 L
FEV6-Post: 2.78 L
FEV6FVC-%PRED-POST: 103 %
FEV6FVC-%Pred-Pre: 103 %
FVC-%Change-Post: 0 %
FVC-%Pred-Post: 79 %
FVC-%Pred-Pre: 79 %
FVC-Post: 2.78 L
FVC-Pre: 2.78 L
POST FEV1/FVC RATIO: 85 %
Post FEV6/FVC ratio: 100 %
Pre FEV1/FVC ratio: 81 %
Pre FEV6/FVC Ratio: 100 %
RV % pred: 72 %
RV: 1.45 L
TLC % PRED: 82 %
TLC: 4.28 L

## 2014-11-17 MED ORDER — SERTRALINE HCL 50 MG PO TABS: 50.0000 mg | ORAL_TABLET | Freq: Every day | ORAL | Status: DC

## 2014-11-17 NOTE — Telephone Encounter (Signed)
Pt calling to say she is doing well on the new med, please  Call her in a refill to [facility] [address]   sertraline (ZOLOFT) 50 MG tablet

## 2014-11-17 NOTE — Progress Notes (Signed)
PFT done today. 

## 2014-11-18 NOTE — Progress Notes (Signed)
Quick Note:  LMTCB ______ 

## 2014-11-21 ENCOUNTER — Encounter: Payer: Self-pay | Admitting: Internal Medicine

## 2014-11-21 ENCOUNTER — Ambulatory Visit (INDEPENDENT_AMBULATORY_CARE_PROVIDER_SITE_OTHER): Payer: BLUE CROSS/BLUE SHIELD | Admitting: Internal Medicine

## 2014-11-21 VITALS — BP 116/72 | HR 89 | Ht 67.0 in | Wt 218.0 lb

## 2014-11-21 DIAGNOSIS — R0600 Dyspnea, unspecified: Secondary | ICD-10-CM | POA: Diagnosis not present

## 2014-11-21 DIAGNOSIS — R938 Abnormal findings on diagnostic imaging of other specified body structures: Secondary | ICD-10-CM

## 2014-11-21 DIAGNOSIS — E669 Obesity, unspecified: Secondary | ICD-10-CM

## 2014-11-21 DIAGNOSIS — R9389 Abnormal findings on diagnostic imaging of other specified body structures: Secondary | ICD-10-CM

## 2014-11-21 NOTE — Progress Notes (Signed)
Subjective:    Patient ID: Julie Campos, female    DOB: 12-12-1955,    MRN: [id number]    Brief patient profile:  8 yowf never smoker with pattern of paroxyms of cough when eats certainfoods x  Around 1986 springtime runny nose /sneeze/ controlled with otc's since 20s similar problem March 2016 but this time with cough onset in April 2016 and referred to pulmonary clinic for 09/21/2014 by Dr Gerda Diss with ? ILD     History of Present Illness  09/21/2014 1st [facility] office visit/ Maurice Ramseur   Chief Complaint  Patient presents with  . Pulmonary Consult    Referred by Dr. Lilyan Punt for eval of abnormal cxr.  Pt c/o cough and SOB since April 2016. She states occurs at rest off and on.  Her cough is mainly non prod, but occ produces min clear sputum.   did have fever back in April 2016 no h/o uti's/ no use of macrodantin/ no chemo/ no collagen vasc dz  Was on protonix but d/c 2 weeks prior to OV  And now overt hb/ concern re long term renal effects noted. But so far not adverse effect Father had pf and cousin on father's side and fathers sister.  Overall better since uri and some yard work but not very active since illness / some residual nasal congestion rec Pantoprazole (protonix) 40 mg   Take  30-60 min before first meal of the day and Pepcid (famotidine)  20 mg one @  bedtime until return to office - this is the best way to tell whether stomach acid is contributing to your problem.   GERD diet   Add HRCT rec due to desats > no PF     11/21/2014 f/u ov/Tayvien Kane re:  unexplained ex 02 desats / lung nodules on CT  s PF   Chief Complaint  Patient presents with  . Follow-up    PFT results; breathing doing well; gets coughing spell when throat is dry or throat get a tickle in it. states feels like she is gasping for air.    Only cough once every 2 weeks now, worked out s sob p "bronchitis resolved" though never really clear what she meant by this.   No obvious day to day or daytime  variability     cp or chest tightness, subjective wheeze or overt sinus or hb symptoms. No unusual exp hx or h/o childhood pna/ asthma or knowledge of premature birth.  Sleeping ok without nocturnal  or early am exacerbation  of respiratory  c/o's or need for noct saba. Also denies any obvious fluctuation of symptoms with weather or environmental changes or other aggravating or alleviating factors except as outlined above   Current Medications, Allergies, Complete Past Medical History, Past Surgical History, Family History, and Social History were reviewed in [employer] record.  ROS  The following are not active complaints unless bolded sore throat, dysphagia, dental problems, itching, sneezing,  nasal congestion or excess/ purulent secretions, ear ache,   fever, chills, sweats, unintended wt loss, classically pleuritic or exertional cp, hemoptysis,  orthopnea pnd or leg swelling, presyncope, palpitations, abdominal pain, anorexia, nausea, vomiting, diarrhea  or change in bowel or bladder habits, change in stools or urine, dysuria,hematuria,  rash, arthralgias, visual complaints, headache, numbness, weakness or ataxia or problems with walking or coordination,  change in mood/affect or memory.  Objective:   Physical Exam  amb wf nad   11/21/2014         218  Wt Readings from Last 3 Encounters:  09/21/14 217 lb 6.4 oz (98.612 kg)  08/08/14 215 lb 8 oz (97.75 kg)  04/20/14 210 lb 12.8 oz (95.618 kg)    Vital signs reviewed   HEENT: nl dentition, turbinates, and orophanx. Nl external ear canals without cough reflex   NECK :  without JVD/Nodes/TM/ nl carotid upstrokes bilaterally   LUNGS: no acc muscle use, slt decreased air movement bilaterally in bases but no adventitial breath sounds    CV:  RRR  no s3 or murmur or increase in P2, no edema   ABD:  soft and nontender with nl excursion in the supine position. No bruits or organomegaly, bowel  sounds nl  MS:  warm without deformities, calf tenderness, cyanosis or clubbing  SKIN: warm and dry without lesions    NEURO:  alert, approp, no deficits     I personally reviewed images and agree with radiology impression as follows:  CXR:  09/15/14 Again noted are coarse lung markings which appear unchanged. There is no focal airspace disease or edema. Heart and mediastinum are within normal limits. The trachea is midline. Mild degenerative changes in the thoracic spine. No large pleural effusions.             Assessment & Plan:   Outpatient Encounter Prescriptions as of 11/21/2014  Medication Sig  . clobetasol (TEMOVATE) 0.05 % external solution   . clonazePAM (KLONOPIN) 0.5 MG tablet TAKE 1 TABLET BY MOUTH EVERY NIGHT AT BEDTIME AS NEEDED FOR ANXIETY  . famotidine (PEPCID) 20 MG tablet One after bfast and One at bedtime  . lidocaine (LIDODERM) 5 %   . meloxicam (MOBIC) 15 MG tablet Take 1 tablet (15 mg total) by mouth daily.  . methocarbamol (ROBAXIN) 750 MG tablet Take 1 tablet (750 mg total) by mouth every 8 (eight) hours as needed for muscle spasms.  . ondansetron (ZOFRAN ODT) 8 MG disintegrating tablet Take 1 tablet (8 mg total) by mouth 3 (three) times daily as needed for nausea or vomiting.  . sertraline (ZOLOFT) 50 MG tablet Take 1 tablet (50 mg total) by mouth daily.  . VOLTAREN 1 % GEL   . [DISCONTINUED] busPIRone (BUSPAR) 10 MG tablet Take 1 tablet (10 mg total) by mouth 2 (two) times daily.  . [DISCONTINUED] famotidine (PEPCID) 20 MG tablet One at bedtime  . [DISCONTINUED] pantoprazole (PROTONIX) 40 MG tablet TK 1 T PO  DAILY 30 TO 60 MINUTES BEFORE FIRST MEAL OF THE DAY  . [DISCONTINUED] traZODone (DESYREL) 100 MG tablet TAKE 1 TABLET BY MOUTH EVERY NIGHT AT BEDTIME. NEEDS OFFICE VISIT PER OFFICE.  . [DISCONTINUED] cefdinir (OMNICEF) 300 MG capsule Take 1 capsule (300 mg total) by mouth 2 (two) times daily.   No facility-administered encounter medications on  file as of 11/21/2014.

## 2014-11-21 NOTE — Patient Instructions (Addendum)
ok to try pepcid 20 mg after bfast and at bedtime instead of pantoprazole before bfast to see if does just as well as what you are on now   Please schedule a follow up visit in 12 months but call sooner if needed for cough or shortness of breath

## 2014-11-23 ENCOUNTER — Other Ambulatory Visit: Payer: Self-pay | Admitting: Family Medicine

## 2014-11-23 NOTE — Telephone Encounter (Signed)
May refill each 4 

## 2014-11-27 ENCOUNTER — Encounter: Payer: Self-pay | Admitting: Internal Medicine

## 2014-11-27 DIAGNOSIS — E669 Obesity, unspecified: Secondary | ICD-10-CM | POA: Insufficient documentation

## 2014-11-27 MED ORDER — FAMOTIDINE 20 MG PO TABS: ORAL_TABLET | ORAL | Status: DC

## 2014-11-27 NOTE — Assessment & Plan Note (Addendum)
-    CT s contrast 09/22/2014 > 1. Mild diffuse bronchial wall thickening with subtle pattern of mild centrilobular ground-glass attenuation micronodularity. This is associated with extensive air trapping. These findings could be seen in the setting of mild hypersensitivity pneumonitis. 2. No other findings to suggest interstitial lung disease at this time. 3. 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 unexplained cough/ desats but I could not identify any risk factors, even in retrospect, so no need to pursue this dx further unless symptoms recur  Since doesn't have pf fine with me to taper gerd rx and just ust h2 bid for now   I had an extended discussion with the patient reviewing all relevant studies completed to date and  lasting 15 to 20 minutes of a 25 minute visit    Each maintenance medication was reviewed in detail including most importantly the difference between maintenance and prns and under what circumstances the prns are to be triggered using an action plan format that is not reflected in the computer generated alphabetically organized AVS.    Please see instructions for details which were reviewed in writing and the patient given a copy highlighting the part that I personally wrote and discussed at today's ov.

## 2014-11-27 NOTE — Assessment & Plan Note (Signed)
Body mass index is 34.14 kg/(m^2).  Lab Results  Component Value Date   TSH 4.300 08/08/2014     Contributing to gerd tendency/ doe/reviewed need  achieve and maintain neg calorie balance > defer f/u primary care including intermittently monitoring thyroid status

## 2014-11-27 NOTE — Assessment & Plan Note (Signed)
Resolved, no further w/u needed for now

## 2015-01-12 ENCOUNTER — Encounter: Payer: Self-pay | Admitting: Nurse Practitioner

## 2015-01-12 ENCOUNTER — Ambulatory Visit (INDEPENDENT_AMBULATORY_CARE_PROVIDER_SITE_OTHER): Payer: BLUE CROSS/BLUE SHIELD | Admitting: Nurse Practitioner

## 2015-01-12 VITALS — BP 110/76 | Temp 98.0°F | Ht 67.0 in | Wt 223.0 lb

## 2015-01-12 DIAGNOSIS — S86911A Strain of unspecified muscle(s) and tendon(s) at lower leg level, right leg, initial encounter: Secondary | ICD-10-CM

## 2015-01-12 DIAGNOSIS — F411 Generalized anxiety disorder: Secondary | ICD-10-CM

## 2015-01-12 DIAGNOSIS — M25561 Pain in right knee: Secondary | ICD-10-CM | POA: Diagnosis not present

## 2015-01-12 DIAGNOSIS — M25512 Pain in left shoulder: Secondary | ICD-10-CM | POA: Diagnosis not present

## 2015-01-12 MED ORDER — SERTRALINE HCL 100 MG PO TABS: 100.0000 mg | ORAL_TABLET | Freq: Every day | ORAL | Status: DC

## 2015-01-13 ENCOUNTER — Encounter: Payer: Self-pay | Admitting: Nurse Practitioner

## 2015-01-13 NOTE — Progress Notes (Signed)
Subjective:  Presents for c/o right knee pain x 3 weeks. Slightly twisted leg while getting into pool. Some problem with lateral movement of the knee but this has resolved over the past 2 days. Better with ice and NSAID. Also c/o left shoulder pain x 2-3 months. No specific history of injury. Mainly near the mid joint area. Under more stress lately due to personal issues. Trouble sleeping.   Objective:   BP 110/76 mmHg  Temp(Src) 98 F (36.7 C) (Oral)  Ht  (1.702 m)  Wt 223 lb (101.152 kg)  BMI 34.92 kg/m2 NAD. Alert, oriented. Lungs clear. Heart RRR. Right knee passive full ROM of knee without tenderness. No joint laxity. Mild tenderness with palpation of lateral anterior joint. Normal gait. Active ROM of left shoulder noted with pain with full rotation and posterior internal rotation. Shoulder height slightly increased and forward on left as compared to right. Tenderness with palpation of mid and anterior shoulder joint line. Hand strength 5+ bilat. Sensation grossly intact. Cheerful affect. Thought logical, coherent and relevant.   Assessment:  Problem List Items Addressed This Visit      Other   Generalized anxiety disorder    Other Visit Diagnoses    Knee strain, right, initial encounter    -  Primary    Left shoulder pain          Plan:  Meds ordered this encounter  Medications  . sertraline (ZOLOFT) 100 MG tablet    Sig: Take 1 tablet (100 mg total) by mouth daily.    Dispense:  30 tablet    Refill:  5    Order Specific Question:  Supervising Provider    Answer:  Merlyn Albert [[id number]]   Expect continued gradual resolution of knee strain. Given number for orthopedic specialist so patient can schedule her own appt. Increase Zoloft to 100 mg. Call back if any problems or no improvement in anxiety. Return if symptoms worsen or fail to improve. Otherwise, routine follow up as planned.

## 2015-01-17 ENCOUNTER — Other Ambulatory Visit: Payer: Self-pay | Admitting: Family Medicine

## 2015-01-23 ENCOUNTER — Telehealth: Payer: Self-pay | Admitting: Family Medicine

## 2015-01-23 NOTE — Telephone Encounter (Signed)
(  Message for Eber Jones) patient was seen by her 01/12/15 and she states she needs orders or referral to be seen by [facility] in [place].

## 2015-01-23 NOTE — Telephone Encounter (Signed)
Please do referral for left shoulder pain (see last note)

## 2015-01-24 ENCOUNTER — Other Ambulatory Visit: Payer: Self-pay | Admitting: Nurse Practitioner

## 2015-01-24 DIAGNOSIS — M25512 Pain in left shoulder: Secondary | ICD-10-CM

## 2015-01-24 NOTE — Telephone Encounter (Signed)
Patient states that it is physical therapy.

## 2015-01-24 NOTE — Telephone Encounter (Signed)
Please clarify with patient. Is she seeing orthopedic specialist or PT? Need to know to put in referral.

## 2015-01-24 NOTE — Telephone Encounter (Signed)
Need referral put in system for [facility] (not sure but may be for PT)

## 2015-01-24 NOTE — Telephone Encounter (Signed)
Referral has been sent.

## 2015-02-06 ENCOUNTER — Ambulatory Visit: Payer: BLUE CROSS/BLUE SHIELD | Admitting: Family Medicine

## 2015-03-07 ENCOUNTER — Other Ambulatory Visit: Payer: Self-pay | Admitting: Internal Medicine

## 2015-03-29 ENCOUNTER — Other Ambulatory Visit: Payer: Self-pay | Admitting: Family Medicine

## 2015-04-05 ENCOUNTER — Telehealth: Payer: Self-pay | Admitting: Internal Medicine

## 2015-04-05 NOTE — Telephone Encounter (Signed)
Called to schedule follow up appointment with Dr. Sherene SiresWert after 09/22/15 Left message for patient to call back.

## 2015-04-19 ENCOUNTER — Other Ambulatory Visit: Payer: Self-pay | Admitting: Family Medicine

## 2015-05-09 NOTE — Telephone Encounter (Signed)
Ok six mo worth 

## 2015-05-25 ENCOUNTER — Other Ambulatory Visit: Payer: Self-pay | Admitting: Family Medicine

## 2015-06-20 ENCOUNTER — Other Ambulatory Visit: Payer: Self-pay | Admitting: Family Medicine

## 2015-06-30 ENCOUNTER — Ambulatory Visit: Payer: BLUE CROSS/BLUE SHIELD | Admitting: Nurse Practitioner

## 2015-07-06 ENCOUNTER — Ambulatory Visit (INDEPENDENT_AMBULATORY_CARE_PROVIDER_SITE_OTHER): Payer: BLUE CROSS/BLUE SHIELD | Admitting: Nurse Practitioner

## 2015-07-06 ENCOUNTER — Encounter: Payer: Self-pay | Admitting: Nurse Practitioner

## 2015-07-06 VITALS — BP 124/70 | Ht 67.0 in | Wt 215.2 lb

## 2015-07-06 DIAGNOSIS — F329 Major depressive disorder, single episode, unspecified: Secondary | ICD-10-CM | POA: Diagnosis not present

## 2015-07-06 DIAGNOSIS — F32A Depression, unspecified: Secondary | ICD-10-CM

## 2015-07-06 MED ORDER — METHOCARBAMOL 750 MG PO TABS: 750.0000 mg | ORAL_TABLET | Freq: Three times a day (TID) | ORAL | Status: DC | PRN

## 2015-07-06 NOTE — Patient Instructions (Signed)
Dr. Benna Dunksoss [facility] [phone]

## 2015-07-08 ENCOUNTER — Encounter: Payer: Self-pay | Admitting: Nurse Practitioner

## 2015-07-08 NOTE — Progress Notes (Signed)
Subjective:  Presents for recheck on her depression. Current medications do not seem to be working as well. Having increased difficulty with sleeping. Denies suicidal or homicidal thoughts or ideation.  Objective:   BP 124/70 mmHg  Ht 5\' 7"  (1.702 m)  Wt 215 lb 4 oz (97.637 kg)  BMI 33.71 kg/m2 NAD. Alert, oriented. Cheerful affect. Thoughts logical coherent and relevant. Dressed appropriately. Lungs clear. Heart regular rate rhythm.  Assessment:  Problem List Items Addressed This Visit      Other   Depression - Primary     Plan:  Meds ordered this encounter  Medications  . methocarbamol (ROBAXIN) 750 MG tablet    Sig: Take 1 tablet (750 mg total) by mouth every 8 (eight) hours as needed for muscle spasms.    Dispense:  30 tablet    Refill:  0    Order Specific Question:  Supervising Provider    Answer:  Merlyn AlbertLUKING, WILLIAM S [[id number]]   Patient given information to she can make an appointment with come behavioral health for evaluation and medication management. Seek help immediately if symptoms worsen. Call back if she needs assistance with referral. Refill of Robaxin per patient request for her left shoulder pain, still receiving physical therapy.

## 2015-07-22 ENCOUNTER — Other Ambulatory Visit: Payer: Self-pay | Admitting: Nurse Practitioner

## 2015-07-22 ENCOUNTER — Other Ambulatory Visit: Payer: Self-pay | Admitting: Family Medicine

## 2015-07-26 ENCOUNTER — Other Ambulatory Visit: Payer: Self-pay | Admitting: Nurse Practitioner

## 2015-08-25 ENCOUNTER — Telehealth: Payer: Self-pay | Admitting: *Deleted

## 2015-08-25 NOTE — Telephone Encounter (Signed)
LMTCB- needs appt mid June 17'

## 2015-08-25 NOTE — Telephone Encounter (Signed)
-----   Message from Nyoka CowdenMichael B Wert, MD sent at 09/22/2014  7:37 PM EDT ----- Ov needed to review ? PF/ nodules on ct 09/22/2014 but do not order ct yet

## 2015-08-29 NOTE — Telephone Encounter (Signed)
Spoke with the pt and scheduled ov with MW for 09/22/15 at 3 pm

## 2015-09-21 ENCOUNTER — Other Ambulatory Visit: Payer: Self-pay | Admitting: Family Medicine

## 2015-09-22 ENCOUNTER — Ambulatory Visit: Payer: BLUE CROSS/BLUE SHIELD | Admitting: Internal Medicine

## 2015-10-21 ENCOUNTER — Other Ambulatory Visit: Payer: Self-pay | Admitting: Nurse Practitioner

## 2015-10-24 ENCOUNTER — Ambulatory Visit (INDEPENDENT_AMBULATORY_CARE_PROVIDER_SITE_OTHER): Payer: BLUE CROSS/BLUE SHIELD | Admitting: Internal Medicine

## 2015-10-24 ENCOUNTER — Encounter: Payer: Self-pay | Admitting: Internal Medicine

## 2015-10-24 ENCOUNTER — Encounter (INDEPENDENT_AMBULATORY_CARE_PROVIDER_SITE_OTHER): Payer: Self-pay

## 2015-10-24 VITALS — BP 104/70 | HR 80 | Ht 66.0 in | Wt 209.0 lb

## 2015-10-24 DIAGNOSIS — R938 Abnormal findings on diagnostic imaging of other specified body structures: Secondary | ICD-10-CM | POA: Diagnosis not present

## 2015-10-24 DIAGNOSIS — R058 Other specified cough: Secondary | ICD-10-CM

## 2015-10-24 DIAGNOSIS — R9389 Abnormal findings on diagnostic imaging of other specified body structures: Secondary | ICD-10-CM

## 2015-10-24 DIAGNOSIS — R05 Cough: Secondary | ICD-10-CM

## 2015-10-24 NOTE — Progress Notes (Signed)
Subjective:    Patient ID: Julie Campos, female    DOB: November 01, 1955,    MRN: [id number]    Brief patient profile:  70 yowf never smoker with pattern of paroxyms of cough when eats certainfoods x  Around 1986 springtime runny nose /sneeze/ controlled with otc's since 20s similar problem March 2016 but this time with cough onset in April 2016 and referred to pulmonary clinic for 09/21/2014 by Dr Gerda Diss with ? ILD     History of Present Illness  09/21/2014 1st [facility] office visit/ [name]   Chief Complaint  Patient presents with  . Pulmonary Consult    Referred by Dr. Lilyan Punt for eval of abnormal cxr.  Pt c/o cough and SOB since April 2016. She states occurs at rest off and on.  Her cough is mainly non prod, but occ produces min clear sputum.   did have fever back in April 2016 no h/o uti's/ no use of macrodantin/ no chemo/ no collagen vasc dz  Was on protonix but d/c 2 weeks prior to OV  And now overt hb/ concern re long term renal effects noted. But so far not adverse effect Father had pf and cousin on father's side and fathers sister.  Overall better since uri and some yard work but not very active since illness / some residual nasal congestion rec Pantoprazole (protonix) 40 mg   Take  30-60 min before first meal of the day and Pepcid (famotidine)  20 mg one @  bedtime until return to office - this is the best way to tell whether stomach acid is contributing to your problem.   GERD diet   Add HRCT rec due to desats > no PF     11/21/2014 f/u ov/Jona Zappone re:  unexplained ex 02 desats / lung nodules on CT  s PF   Chief Complaint  Patient presents with  . Follow-up    PFT results; breathing doing well; gets coughing spell when throat is dry or throat get a tickle in it. states feels like she is gasping for air.  Only cough once every 2 weeks now, worked out s sob p "bronchitis resolved" though never really clear what she meant by this.  rec ok to try pepcid 20 mg after  bfast and at bedtime instead of pantoprazole before bfast to see if does just as well as what you are on now  Please schedule a follow up visit in 12 months but call sooner if needed for cough or shortness of breath    10/24/2015  f/u ov/[name] re:    mpns x 3 mm/ never regular smoker / cough resolved  Chief Complaint  Patient presents with  . Follow-up    Breathing is doing well and she denies any cough. No new co's today.    Not limited by breathing from desired activities  / maint rx ppi q am and pepcid qhs s recrrent cough   No obvious day to day or daytime variability   cp or chest tightness, subjective wheeze or overt sinus or hb symptoms. No unusual exp hx or h/o childhood pna/ asthma or knowledge of premature birth.  Sleeping ok without nocturnal  or early am exacerbation  of respiratory  c/o's or need for noct saba. Also denies any obvious fluctuation of symptoms with weather or environmental changes or other aggravating or alleviating factors except as outlined above   Current Medications, Allergies, Complete Past Medical History, Past Surgical History, Family History, and Social  History were reviewed in [facility] electronic medical record.  ROS  The following are not active complaints unless bolded sore throat, dysphagia, dental problems, itching, sneezing,  nasal congestion or excess/ purulent secretions, ear ache,   fever, chills, sweats, unintended wt loss, classically pleuritic or exertional cp, hemoptysis,  orthopnea pnd or leg swelling, presyncope, palpitations, abdominal pain, anorexia, nausea, vomiting, diarrhea  or change in bowel or bladder habits, change in stools or urine, dysuria,hematuria,  rash, arthralgias, visual complaints, headache, numbness, weakness or ataxia or problems with walking or coordination,  change in mood/affect or memory.                    Objective:  Physical Exam  amb wf nad   11/21/2014         218 >  10/24/2015   209     09/21/14 217 lb  6.4 oz (98.612 kg)  08/08/14 215 lb 8 oz (97.75 kg)  04/20/14 210 lb 12.8 oz (95.618 kg)    Vital signs reviewed   HEENT: nl dentition, turbinates, and orophanx. Nl external ear canals without cough reflex   NECK :  without JVD/Nodes/TM/ nl carotid upstrokes bilaterally   LUNGS: no acc muscle use, clear to A and P   CV:  RRR  no s3 or murmur or increase in P2, no edema   ABD:  soft and nontender with nl excursion in the supine position. No bruits or organomegaly, bowel sounds nl  MS:  warm without deformities, calf tenderness, cyanosis or clubbing  SKIN: warm and dry without lesions    NEURO:  alert, approp, no deficits       I personally reviewed images and agree with radiology impression as follows:  CT Chest   08/22/14   1. Mild diffuse bronchial wall thickening with subtle pattern of mild centrilobular ground-glass attenuation micronodularity. This is associated with extensive air trapping. These findings could be seen in the setting of mild hypersensitivity pneumonitis. 2. No other findings to suggest interstitial lung disease at this time. 3. Two tiny pulmonary nodules in the lungs bilaterally, largest of which measures 3 mm in the lingula. These are highly nonspecific. If the patient is at high risk for bronchogenic carcinoma, follow-up chest CT at 1 year is recommended. If the patient is at low risk, no follow-up is needed.          Assessment & Plan:

## 2015-10-24 NOTE — Patient Instructions (Addendum)
Try off pantoprazole and on  pepcid 20 mg  Twice daily after meals    If you are satisfied with your treatment plan,  let your doctor know and he/she can either refill your medications or you can return here when your prescription runs out.     If in any way you are not 100% satisfied,  please tell us.  If 100% better, tell your friends!  Pulmonary follow up is as needed

## 2015-10-29 DIAGNOSIS — R058 Other specified cough: Secondary | ICD-10-CM | POA: Insufficient documentation

## 2015-10-29 DIAGNOSIS — R05 Cough: Secondary | ICD-10-CM | POA: Insufficient documentation

## 2015-10-29 NOTE — Assessment & Plan Note (Signed)
Upper airway cough syndrome, so named because it's frequently impossible to sort out how much is  CR/sinusitis with freq throat clearing (which can be related to primary GERD)   vs  causing  secondary (" extra esophageal")  GERD from wide swings in gastric pressure that occur with throat clearing, often  promoting self use of mint and menthol lozenges that reduce the lower esophageal sphincter tone and exacerbate the problem further in a cyclical fashion.   These are the same pts (now being labeled as having "irritable larynx syndrome" by some cough centers) who not infrequently have a history of having failed to tolerate ace inhibitors,  dry powder inhalers or biphosphonates or report having atypical reflux symptoms that don't respond to standard doses of PPI , and are easily confused as having aecopd or asthma flares by even experienced allergists/ pulmonologists.    I had an extended final summary discussion with the patient reviewing all relevant studies completed to date and  lasting 15 to 20 minutes of a 25 minute visit on the following issues:   Discussed the recent press about ppi's in the context of a statistically significant (but questionably clinically relevant) increase in CRI in pts on ppi vs h2's > bottom line is the lowest dose of ppi that controls   gerd is the right dose and if that dose is zero that's fine esp since h2's are cheaper > so try pepcid 20 mg bid and f/u with Dr Gerda DissLuking/ GI prn    No further pulmonary f/u needed

## 2015-10-29 NOTE — Assessment & Plan Note (Signed)
- -    09/21/2014  Walked RA x 1 laps @ 185 ft each stopped due to 77% at nl pace and no sob  -  CT s contrast 09/22/2014 > 1. Mild diffuse bronchial wall thickening with subtle pattern of mild centrilobular ground-glass attenuation micronodularity. This is associated with extensive air trapping. These findings could be seen in the setting of mild hypersensitivity pneumonitis. 2. No other findings to suggest interstitial lung disease at this time. 3. Two tiny pulmonary nodules in the lungs bilaterally, largest of which measures 3 mm in the lingula  - PFTs 11/17/14  VC 2.83 (81%) with no obst with dlco 81 corrects to 95 and ERV 64% - 11/21/2014  Walked RA x 3 laps @ 185 ft each stopped due to End of study, nl pace, no sob or desat    - 10/24/2015 rec no further CT's in absence of symptoms

## 2015-12-05 ENCOUNTER — Telehealth: Payer: Self-pay | Admitting: Family Medicine

## 2015-12-05 MED ORDER — FAMOTIDINE 20 MG PO TABS: ORAL_TABLET | ORAL | 11 refills | Status: DC

## 2015-12-05 NOTE — Telephone Encounter (Signed)
Ok one yrs worth, one po qd

## 2015-12-05 NOTE — Telephone Encounter (Signed)
Left message informing patient that we did send in refills on requested medication.

## 2015-12-05 NOTE — Telephone Encounter (Signed)
Pt is needing a refill on her famotidine (PEPCID) 20 MG tablet  It was originally prescribed by her pulmonologist, but the pt is no longer under their care. Please advise.    [facility] [phone] - [place], [place] - [address] [facility] [address]

## 2015-12-08 ENCOUNTER — Other Ambulatory Visit: Payer: Self-pay

## 2015-12-08 MED ORDER — FAMOTIDINE 20 MG PO TABS: ORAL_TABLET | ORAL | 11 refills | Status: DC

## 2015-12-12 ENCOUNTER — Other Ambulatory Visit: Payer: Self-pay | Admitting: Family Medicine

## 2015-12-23 IMAGING — CT CT CHEST HIGH RESOLUTION W/O CM
1 of 5 series · 4 of 36 positions shown, 5 images · non-contrast
Comparison: No priors.

CLINICAL DATA: 58-year-old female with 3 month history of shortness
of breath with exertion. Family history of pulmonary fibrosis.
Cough, intermittently productive of sputum.

EXAM:
CT CHEST WITHOUT CONTRAST
TECHNIQUE: Multidetector CT imaging of the chest was performed following the
standard protocol without intravenous contrast. High resolution
imaging of the lungs, as well as inspiratory and expiratory imaging,
was performed.

[Series 602: cor · coronal · 0.62mm/px · 4 of 116 slices shown, 5 images]
[im 24/116  mediastinal]
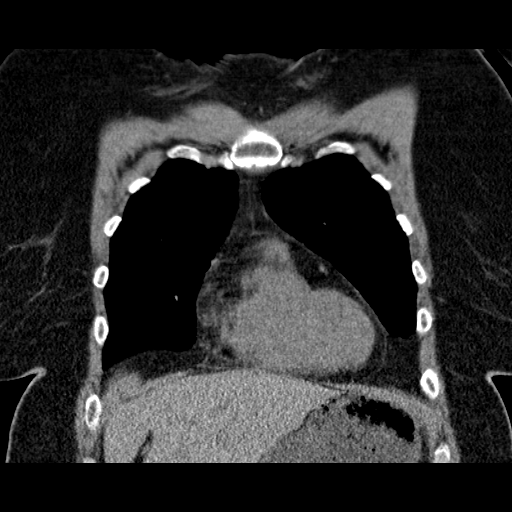
[im 24/116  lung]
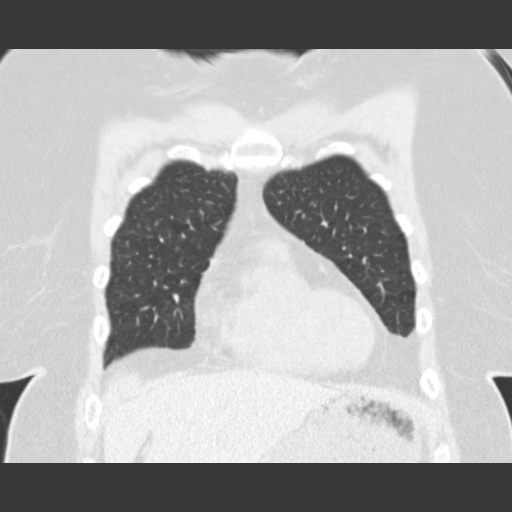
[im 47/116  lung]
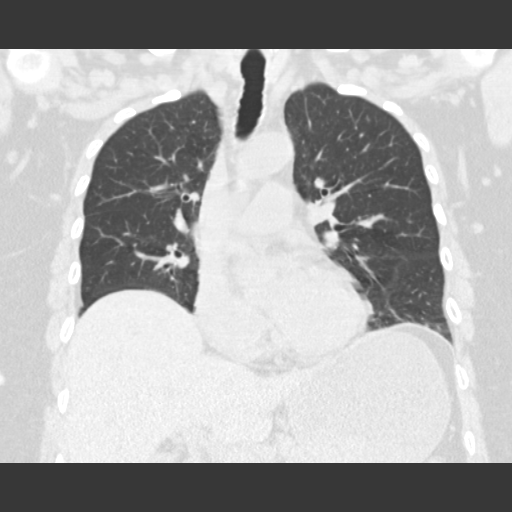
[im 70/116  lung]
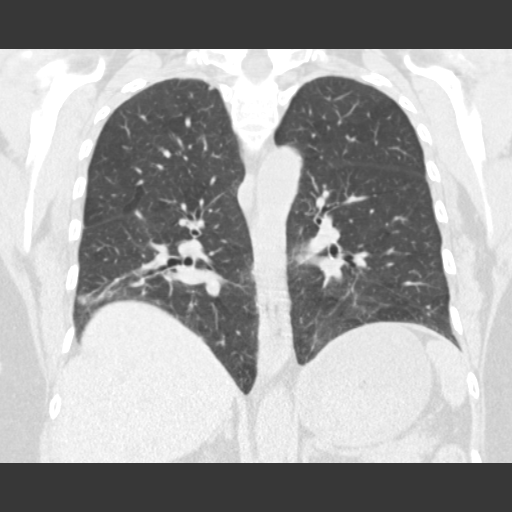
[im 93/116  lung]
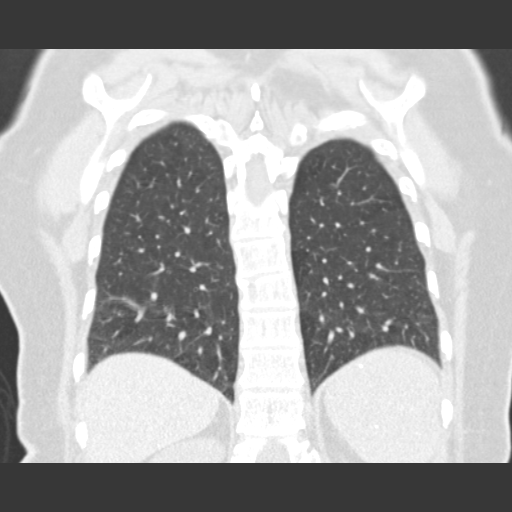

[4 of 36 positions shown; findings below may reference images not displayed]

FINDINGS: Mediastinum/Lymph Nodes: Heart size is normal. There is no
significant pericardial fluid, thickening or pericardial
calcification. There is atherosclerosis of the thoracic aorta, the
great vessels of the mediastinum and the coronary arteries,
including calcified atherosclerotic plaque in the distal left main
and left anterior descending coronary arteries. No pathologically
enlarged mediastinal or hilar lymph nodes. Please note that accurate
exclusion of hilar adenopathy is limited on noncontrast CT scans.
Several coarsely calcified mediastinal and right hilar lymph nodes.
Esophagus is unremarkable in appearance. No axillary
lymphadenopathy.

Lungs/Pleura: High-resolution images demonstrate no significant
regions of ground-glass attenuation, subpleural reticulation,
parenchymal banding, traction bronchiectasis or frank honeycombing.
Mild diffuse bronchial wall thickening with very subtle
centrilobular ground-glass attenuation micro nodularity is noted.
Inspiratory and expiratory imaging demonstrates extensive air
trapping, indicative of small airways disease. No acute
consolidative airspace disease. No pleural effusions. 3 mm nodule in
the lingula (image 27 of series 5). 2 mm right upper lobe nodule
(image 15 of series 5). Small calcified granuloma in the right
middle lobe incidentally noted. No larger more suspicious appearing
pulmonary nodules or masses are otherwise noted.

Upper Abdomen: Numerous calcified granulomas in the spleen.

Musculoskeletal/Soft Tissues: There are no aggressive appearing
lytic or blastic lesions noted in the visualized portions of the
skeleton.
IMPRESSION: 1. Mild diffuse bronchial wall thickening with subtle pattern of
mild centrilobular ground-glass attenuation micronodularity. This is
associated with extensive air trapping. These findings could be seen
in the setting of mild hypersensitivity pneumonitis.
2. No other findings to suggest interstitial lung disease at this
time.
3. Two tiny pulmonary nodules in the lungs bilaterally, largest of
which measures 3 mm in the lingula. These are highly nonspecific. If
the patient is at high risk for bronchogenic carcinoma, follow-up
chest CT at 1 year is recommended. If the patient is at low risk, no
follow-up is needed. This recommendation follows the consensus
statement: Guidelines for Management of Small Pulmonary Nodules
Detected on CT Scans: A Statement from the [HOSPITAL] as
published in Radiology 6224; [DATE].
4. Atherosclerosis, including distal left main and left anterior
descending coronary artery disease. Please note that although the
presence of coronary artery calcium documents the presence of
coronary artery disease, the severity of this disease and any
potential stenosis cannot be assessed on this non-gated CT
examination. Assessment for potential risk factor modification,
dietary therapy or pharmacologic therapy may be warranted, if
clinically indicated.
5. Old granulomatous disease, as above.

## 2016-01-03 ENCOUNTER — Other Ambulatory Visit: Payer: Self-pay | Admitting: Family Medicine

## 2016-01-04 NOTE — Telephone Encounter (Signed)
One mo for both, needs six mo f u soon caroly nor myself

## 2016-01-04 NOTE — Telephone Encounter (Signed)
See prev call for other med one mo , needs six mo f u

## 2016-01-16 ENCOUNTER — Other Ambulatory Visit: Payer: Self-pay | Admitting: Nurse Practitioner

## 2016-01-17 ENCOUNTER — Other Ambulatory Visit: Payer: Self-pay | Admitting: Family Medicine

## 2016-01-18 NOTE — Telephone Encounter (Signed)
Saw where six mo ago pt saw carolyn and disc had re f u with dr Tenny Crawross for mh issues, did she do this? If so, dr Tenny Crawross should rx thiw med. If not, thirty d worth and needs f u visit with myself or carolyn

## 2016-01-25 ENCOUNTER — Ambulatory Visit: Payer: BLUE CROSS/BLUE SHIELD | Admitting: Nurse Practitioner

## 2016-01-26 ENCOUNTER — Ambulatory Visit: Payer: BLUE CROSS/BLUE SHIELD | Admitting: Nurse Practitioner

## 2016-02-14 ENCOUNTER — Other Ambulatory Visit: Payer: Self-pay | Admitting: Family Medicine

## 2016-02-15 NOTE — Telephone Encounter (Signed)
30 d worth, rec f u ov

## 2016-02-28 ENCOUNTER — Ambulatory Visit: Payer: BLUE CROSS/BLUE SHIELD | Admitting: Nurse Practitioner

## 2016-03-08 ENCOUNTER — Other Ambulatory Visit: Payer: Self-pay | Admitting: Family Medicine

## 2016-03-11 NOTE — Telephone Encounter (Signed)
plz call pt, carolyn saw back late spring at that time looked like was going to f u with [facility] but apparently did not, give 30 d only both rxs, have pt schedule app when u get her on the phone with carolyn or myself

## 2016-03-13 ENCOUNTER — Ambulatory Visit (INDEPENDENT_AMBULATORY_CARE_PROVIDER_SITE_OTHER): Payer: BLUE CROSS/BLUE SHIELD | Admitting: Nurse Practitioner

## 2016-03-13 ENCOUNTER — Ambulatory Visit: Payer: BLUE CROSS/BLUE SHIELD | Admitting: Nurse Practitioner

## 2016-03-13 ENCOUNTER — Encounter: Payer: Self-pay | Admitting: Nurse Practitioner

## 2016-03-13 VITALS — BP 108/70 | Ht 67.0 in | Wt 216.4 lb

## 2016-03-13 DIAGNOSIS — Z114 Encounter for screening for human immunodeficiency virus [HIV]: Secondary | ICD-10-CM

## 2016-03-13 DIAGNOSIS — Z1231 Encounter for screening mammogram for malignant neoplasm of breast: Secondary | ICD-10-CM | POA: Diagnosis not present

## 2016-03-13 DIAGNOSIS — F329 Major depressive disorder, single episode, unspecified: Secondary | ICD-10-CM

## 2016-03-13 DIAGNOSIS — Z13220 Encounter for screening for lipoid disorders: Secondary | ICD-10-CM | POA: Diagnosis not present

## 2016-03-13 DIAGNOSIS — F411 Generalized anxiety disorder: Secondary | ICD-10-CM | POA: Diagnosis not present

## 2016-03-13 DIAGNOSIS — Z1211 Encounter for screening for malignant neoplasm of colon: Secondary | ICD-10-CM | POA: Diagnosis not present

## 2016-03-13 DIAGNOSIS — F32A Depression, unspecified: Secondary | ICD-10-CM

## 2016-03-13 DIAGNOSIS — R5383 Other fatigue: Secondary | ICD-10-CM

## 2016-03-13 DIAGNOSIS — K219 Gastro-esophageal reflux disease without esophagitis: Secondary | ICD-10-CM

## 2016-03-13 DIAGNOSIS — Z1159 Encounter for screening for other viral diseases: Secondary | ICD-10-CM

## 2016-03-13 MED ORDER — SERTRALINE HCL 100 MG PO TABS: ORAL_TABLET | ORAL | 2 refills | Status: DC

## 2016-03-13 MED ORDER — BUSPIRONE HCL 10 MG PO TABS: 10.0000 mg | ORAL_TABLET | Freq: Two times a day (BID) | ORAL | 2 refills | Status: DC

## 2016-03-13 MED ORDER — ONDANSETRON 8 MG PO TBDP: 8.0000 mg | ORAL_TABLET | Freq: Three times a day (TID) | ORAL | 0 refills | Status: DC | PRN

## 2016-03-13 MED ORDER — CLONAZEPAM 0.5 MG PO TABS: ORAL_TABLET | ORAL | 2 refills | Status: DC

## 2016-03-13 MED ORDER — PANTOPRAZOLE SODIUM 40 MG PO TBEC: 40.0000 mg | DELAYED_RELEASE_TABLET | Freq: Every day | ORAL | 2 refills | Status: DC

## 2016-03-14 ENCOUNTER — Encounter: Payer: Self-pay | Admitting: Nurse Practitioner

## 2016-03-14 ENCOUNTER — Encounter: Payer: Self-pay | Admitting: Family Medicine

## 2016-03-14 NOTE — Progress Notes (Signed)
Subjective:  Presents for routine follow up. Depression and anxiety stable overall on current regimen although has had increased anxiety and stress lately. Reflux has been acting up lately. Is due for GYN physical with Dr. Arelia SneddonMcComb. Needs other preventive health maintenance.   Objective:   BP 108/70   Ht 5\' 7"  (1.702 m)   Wt 216 lb 6 oz (98.1 kg)   BMI 33.89 kg/m  NAD. Alert, oriented. Lungs clear. Heart RRR. Abdomen soft, non distended and non tender.   Assessment:  Problem List Items Addressed This Visit      Digestive   Esophageal reflux   Relevant Medications   pantoprazole (PROTONIX) 40 MG tablet   ondansetron (ZOFRAN ODT) 8 MG disintegrating tablet     Other   Depression - Primary   Relevant Medications   sertraline (ZOLOFT) 100 MG tablet   busPIRone (BUSPAR) 10 MG tablet   Generalized anxiety disorder    Other Visit Diagnoses    Other fatigue       Relevant Orders   CBC with Differential/Platelet   Basic metabolic panel   Hepatic function panel   Lipid panel   TSH   VITAMIN D 25 Hydroxy (Vit-D Deficiency, Fractures)   Visit for screening mammogram       Relevant Orders   MM DIGITAL SCREENING BILATERAL   Screening cholesterol level       Relevant Orders   Lipid panel   Screening for HIV (human immunodeficiency virus)       Relevant Orders   HIV antibody   Need for hepatitis C screening test       Relevant Orders   Hepatitis C Antibody     Plan:  Meds ordered this encounter  Medications  . pantoprazole (PROTONIX) 40 MG tablet    Sig: Take 1 tablet (40 mg total) by mouth daily.    Dispense:  30 tablet    Refill:  2    Order Specific Question:   Supervising Provider    Answer:   Merlyn AlbertLUKING, WILLIAM S [[id number]]  . clonazePAM (KLONOPIN) 0.5 MG tablet    Sig: TAKE 1 TABLET BY MOUTH EVERY NIGHT AT BEDTIME AS NEEDED FOR ANXIETY    Dispense:  30 tablet    Refill:  2    Order Specific Question:   Supervising Provider    Answer:   Merlyn AlbertLUKING, WILLIAM S [[id number]]  .  sertraline (ZOLOFT) 100 MG tablet    Sig: TAKE 1 TABLET(100 MG) BY MOUTH DAILY    Dispense:  30 tablet    Refill:  2    Needs office visit    Order Specific Question:   Supervising Provider    Answer:   Merlyn AlbertLUKING, WILLIAM S [[id number]]  . busPIRone (BUSPAR) 10 MG tablet    Sig: Take 1 tablet (10 mg total) by mouth 2 (two) times daily.    Dispense:  60 tablet    Refill:  2    Order Specific Question:   Supervising Provider    Answer:   Merlyn AlbertLUKING, WILLIAM S [[id number]]  . ondansetron (ZOFRAN ODT) 8 MG disintegrating tablet    Sig: Take 1 tablet (8 mg total) by mouth 3 (three) times daily as needed for nausea or vomiting.    Dispense:  30 tablet    Refill:  0    Order Specific Question:   Supervising Provider    Answer:   Merlyn AlbertLUKING, WILLIAM S [[id number]]   Switch to Protonix for 4-6 weeks. Call back if reflux persists. If  resolved, go back to Pepcid at that time. Start walking program. Will refer for screening colonoscopy. Recommend that she schedule GYN physical.   Return in about 3 months (around 06/12/2016) for recheck.

## 2016-04-09 ENCOUNTER — Other Ambulatory Visit: Payer: Self-pay | Admitting: Family Medicine

## 2016-06-04 ENCOUNTER — Other Ambulatory Visit: Payer: Self-pay | Admitting: Nurse Practitioner

## 2016-06-04 ENCOUNTER — Other Ambulatory Visit: Payer: Self-pay | Admitting: Family Medicine

## 2016-06-19 ENCOUNTER — Ambulatory Visit: Payer: BLUE CROSS/BLUE SHIELD | Admitting: Nurse Practitioner

## 2016-06-27 ENCOUNTER — Ambulatory Visit: Payer: BLUE CROSS/BLUE SHIELD | Admitting: Nurse Practitioner

## 2016-07-02 ENCOUNTER — Other Ambulatory Visit: Payer: Self-pay | Admitting: Nurse Practitioner

## 2016-07-04 ENCOUNTER — Ambulatory Visit (INDEPENDENT_AMBULATORY_CARE_PROVIDER_SITE_OTHER): Payer: BLUE CROSS/BLUE SHIELD | Admitting: Nurse Practitioner

## 2016-07-04 ENCOUNTER — Encounter: Payer: Self-pay | Admitting: Nurse Practitioner

## 2016-07-04 VITALS — BP 128/74 | Ht 67.0 in | Wt 220.4 lb

## 2016-07-04 DIAGNOSIS — F329 Major depressive disorder, single episode, unspecified: Secondary | ICD-10-CM | POA: Diagnosis not present

## 2016-07-04 DIAGNOSIS — G4700 Insomnia, unspecified: Secondary | ICD-10-CM

## 2016-07-04 DIAGNOSIS — F411 Generalized anxiety disorder: Secondary | ICD-10-CM | POA: Diagnosis not present

## 2016-07-04 DIAGNOSIS — F32A Depression, unspecified: Secondary | ICD-10-CM

## 2016-07-04 MED ORDER — CLONAZEPAM 0.5 MG PO TABS: ORAL_TABLET | ORAL | 2 refills | Status: DC

## 2016-07-05 ENCOUNTER — Encounter: Payer: Self-pay | Admitting: Nurse Practitioner

## 2016-07-05 NOTE — Progress Notes (Signed)
Subjective:  Presents for recheck on anxiety and depression. Doing well except for insomnia. Has taken Klonopin on occasion which works well for sleep. Is also seeing a Veterinary surgeoncounselor.   Objective:   BP 128/74   Ht 5\' 7"  (1.702 m)   Wt 220 lb 6 oz (100 kg)   BMI 34.52 kg/m  NAD. Alert, oriented. Lungs clear. Heart RRR. Thoughts logical, coherent and relevant. Cheerful affect. Dressed appropriately.   Assessment:   Problem List Items Addressed This Visit      Other   Depression   Generalized anxiety disorder - Primary   Insomnia       Plan:  Continue current meds. Use Klonopin at night for sleep as needed. Continue counseling. Return in about 4 months (around 11/03/2016).

## 2016-07-30 ENCOUNTER — Other Ambulatory Visit: Payer: Self-pay | Admitting: Family Medicine

## 2016-07-31 ENCOUNTER — Other Ambulatory Visit: Payer: Self-pay | Admitting: Family Medicine

## 2016-09-03 ENCOUNTER — Other Ambulatory Visit: Payer: Self-pay | Admitting: Nurse Practitioner

## 2016-09-03 ENCOUNTER — Other Ambulatory Visit: Payer: Self-pay | Admitting: Family Medicine

## 2016-09-16 ENCOUNTER — Telehealth: Payer: Self-pay | Admitting: Nurse Practitioner

## 2016-09-16 NOTE — Telephone Encounter (Addendum)
Patient notified she can call and schedule her mammogram at her convenience. [facility] for her to call and schedule her mammo. Patient verbalized understanding.

## 2016-09-16 NOTE — Telephone Encounter (Signed)
Patient is scheduled for 09/25/16 with Julie Campos for her physical.  She wants to know if she has to wait until then to have a mammogram scheduled?

## 2016-09-17 ENCOUNTER — Encounter: Payer: BLUE CROSS/BLUE SHIELD | Admitting: Nurse Practitioner

## 2016-09-19 ENCOUNTER — Encounter: Payer: BLUE CROSS/BLUE SHIELD | Admitting: Nurse Practitioner

## 2016-09-24 ENCOUNTER — Telehealth: Payer: Self-pay | Admitting: General Practice

## 2016-09-24 NOTE — Telephone Encounter (Signed)
Patient is coming in on Thursday of this week to be seen

## 2016-09-24 NOTE — Telephone Encounter (Signed)
Patient called in to schedule her screening tcs  She's experiencing some heartburn so I will make her an ov.

## 2016-09-25 ENCOUNTER — Ambulatory Visit (HOSPITAL_COMMUNITY)
Admission: RE | Admit: 2016-09-25 | Discharge: 2016-09-25 | Disposition: A | Discharge status: Home / Self Care | Payer: BLUE CROSS/BLUE SHIELD | Source: Ambulatory Visit | Attending: Nurse Practitioner | Admitting: Nurse Practitioner

## 2016-09-25 ENCOUNTER — Telehealth: Payer: Self-pay | Admitting: Nurse Practitioner

## 2016-09-25 ENCOUNTER — Encounter: Payer: Self-pay | Admitting: Nurse Practitioner

## 2016-09-25 ENCOUNTER — Other Ambulatory Visit: Payer: Self-pay | Admitting: Nurse Practitioner

## 2016-09-25 ENCOUNTER — Ambulatory Visit (INDEPENDENT_AMBULATORY_CARE_PROVIDER_SITE_OTHER): Payer: BLUE CROSS/BLUE SHIELD | Admitting: Nurse Practitioner

## 2016-09-25 VITALS — BP 110/72 | Ht 67.0 in | Wt 220.0 lb

## 2016-09-25 DIAGNOSIS — Z0000 Encounter for general adult medical examination without abnormal findings: Secondary | ICD-10-CM

## 2016-09-25 DIAGNOSIS — Z1231 Encounter for screening mammogram for malignant neoplasm of breast: Secondary | ICD-10-CM | POA: Insufficient documentation

## 2016-09-25 DIAGNOSIS — Z1151 Encounter for screening for human papillomavirus (HPV): Secondary | ICD-10-CM | POA: Diagnosis not present

## 2016-09-25 DIAGNOSIS — Z124 Encounter for screening for malignant neoplasm of cervix: Secondary | ICD-10-CM | POA: Diagnosis not present

## 2016-09-25 DIAGNOSIS — M4013 Other secondary kyphosis, cervicothoracic region: Secondary | ICD-10-CM | POA: Insufficient documentation

## 2016-09-25 MED ORDER — MELOXICAM 15 MG PO TABS: 15.0000 mg | ORAL_TABLET | Freq: Every day | ORAL | 0 refills | Status: DC

## 2016-09-25 NOTE — Telephone Encounter (Signed)
done

## 2016-09-25 NOTE — Progress Notes (Signed)
Subjective:    Patient ID: Julie Campos, female    DOB: 1955/05/22, 61 y.o.   MRN: [id number]  HPI Presents for her wellness exam. No vaginal bleeding or pelvic pain. Same sexual partner. Currently undergoing PT for back pain. 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 Her mother had similar issues. Regular vision and dental exams. Went through a minor depressive episode this past weekend for 2 days but better. Still seeing a counselor.     Review of Systems  Constitutional: Positive for fatigue. Negative for activity change and appetite change.  HENT: Negative for dental problem, ear pain, sinus pressure and sore throat.   Respiratory: Negative for cough, chest tightness, shortness of breath and wheezing.   Cardiovascular: Negative for chest pain.  Gastrointestinal: Negative for abdominal distention, abdominal pain, blood in stool, constipation, diarrhea, nausea and vomiting.  Genitourinary: Negative for difficulty urinating, dysuria, enuresis, frequency, genital sores, pelvic pain, urgency, vaginal bleeding and vaginal discharge.  Musculoskeletal: Positive for back pain.  Psychiatric/Behavioral: Positive for dysphoric mood.       Objective:   Physical Exam  Constitutional: She is oriented to person, place, and time. She appears well-developed. No distress.  HENT:  Right Ear: External ear normal.  Left Ear: External ear normal.  Mouth/Throat: Oropharynx is clear and moist.  Neck: Normal range of motion. Neck supple. No tracheal deviation present. No thyromegaly present.  Cardiovascular: Normal rate, regular rhythm and normal heart sounds.  Exam reveals no gallop.   No murmur heard. Pulmonary/Chest: Effort normal and breath sounds normal.  Abdominal: Soft. She exhibits no distension. There is no tenderness.  Genitourinary: Vagina normal and uterus normal. No vaginal discharge found.  Genitourinary Comments: External GU: no rashes or lesions. Vagina: no discharge.  Cervix normal in appearance. No CMT. Bimanual exam: no tenderness or obvious masses.   Musculoskeletal: She exhibits no edema.  Significant cervicothoracic kyphosis noted.   Lymphadenopathy:    She has no cervical adenopathy.  Neurological: She is alert and oriented to person, place, and time.  Skin: Skin is warm and dry. No rash noted.  Psychiatric: She has a normal mood and affect. Her behavior is normal.  Vitals reviewed. Breast exam: no masses; axillae no adenopathy.        Assessment & Plan:   Problem List Items Addressed This Visit      Musculoskeletal and Integument   Other secondary kyphosis, cervicothoracic region   Relevant Orders   DG Bone Density    Other Visit Diagnoses    Routine general medical examination at a health care facility    -  Primary   Relevant Orders   Pap IG and HPV (high risk) DNA detection   IFOBT POC (occult bld, rslt in office)   VITAMIN D 25 Hydroxy (Vit-D Deficiency, Fractures)   Basic metabolic panel   CBC with Differential/Platelet   Lipid panel   Hepatic function panel   TSH   Screening for cervical cancer       Relevant Orders   Pap IG and HPV (high risk) DNA detection   Screening for HPV (human papillomavirus)       Relevant Orders   Pap IG and HPV (high risk) DNA detection     Patient wanted to get colonoscopy done before the end of the month before she switches insurance but that will be difficult to get done in such a short time. Agrees to iFOBT for now. Low risk based on personal and family history. Had  mammogram today. Encouraged regular activity and increase socialization to help with depression. Call back if further problems. Recommend daily vitamin D/calcium. Return in about 6 months (around 03/27/2017) for recheck.

## 2016-09-25 NOTE — Telephone Encounter (Signed)
Patient is requesting a  refill on her Meloxicam.Please advise

## 2016-09-26 ENCOUNTER — Ambulatory Visit: Payer: BLUE CROSS/BLUE SHIELD | Admitting: Nurse Practitioner

## 2016-09-26 ENCOUNTER — Other Ambulatory Visit: Payer: Self-pay | Admitting: Nurse Practitioner

## 2016-09-26 DIAGNOSIS — N6320 Unspecified lump in the left breast, unspecified quadrant: Secondary | ICD-10-CM

## 2016-09-27 LAB — PAP IG AND HPV HIGH-RISK
HPV, HIGH-RISK: NEGATIVE
PAP Smear Comment: 0

## 2016-10-01 LAB — CBC WITH DIFFERENTIAL/PLATELET
BASOS ABS: 0 10*3/uL (ref 0.0–0.2)
Basos: 1 %
EOS (ABSOLUTE): 0.1 10*3/uL (ref 0.0–0.4)
Eos: 4 %
HEMOGLOBIN: 12.8 g/dL (ref 11.1–15.9)
Hematocrit: 38.9 % (ref 34.0–46.6)
IMMATURE GRANS (ABS): 0 10*3/uL (ref 0.0–0.1)
Immature Granulocytes: 0 %
LYMPHS: 39 %
Lymphocytes Absolute: 1.6 10*3/uL (ref 0.7–3.1)
MCH: 30.9 pg (ref 26.6–33.0)
MCHC: 32.9 g/dL (ref 31.5–35.7)
MCV: 94 fL (ref 79–97)
MONOCYTES: 9 %
Monocytes Absolute: 0.4 10*3/uL (ref 0.1–0.9)
NEUTROS ABS: 1.9 10*3/uL (ref 1.4–7.0)
Neutrophils: 47 %
Platelets: 308 10*3/uL (ref 150–379)
RBC: 4.14 x10E6/uL (ref 3.77–5.28)
RDW: 13 % (ref 12.3–15.4)
WBC: 4 10*3/uL (ref 3.4–10.8)

## 2016-10-01 LAB — BASIC METABOLIC PANEL
BUN/Creatinine Ratio: 11 (ref 9–23)
BUN: 8 mg/dL (ref 6–24)
CHLORIDE: 102 mmol/L (ref 96–106)
CO2: 25 mmol/L (ref 20–29)
CREATININE: 0.76 mg/dL (ref 0.57–1.00)
Calcium: 9.5 mg/dL (ref 8.7–10.2)
GFR, EST AFRICAN AMERICAN: 99 mL/min/{1.73_m2} (ref >59)
GFR, EST NON AFRICAN AMERICAN: 86 mL/min/{1.73_m2} (ref >59)
Glucose: 93 mg/dL (ref 65–99)
Potassium: 4.5 mmol/L (ref 3.5–5.2)
Sodium: 142 mmol/L (ref 134–144)

## 2016-10-01 LAB — HEPATIC FUNCTION PANEL
ALBUMIN: 4.3 g/dL (ref 3.5–5.5)
ALT: 13 IU/L (ref 0–32)
AST: 18 IU/L (ref 0–40)
Alkaline Phosphatase: 108 IU/L (ref 39–117)
Bilirubin Total: 0.4 mg/dL (ref 0.0–1.2)
Bilirubin, Direct: 0.09 mg/dL (ref 0.00–0.40)
Total Protein: 6.9 g/dL (ref 6.0–8.5)

## 2016-10-01 LAB — VITAMIN D 25 HYDROXY (VIT D DEFICIENCY, FRACTURES): Vit D, 25-Hydroxy: 13.7 ng/mL — ABNORMAL LOW (ref 30.0–100.0)

## 2016-10-01 LAB — LIPID PANEL
CHOLESTEROL TOTAL: 201 mg/dL — AB (ref 100–199)
Chol/HDL Ratio: 4.1 ratio (ref 0.0–4.4)
HDL: 49 mg/dL (ref >39)
LDL CALC: 137 mg/dL — AB (ref 0–99)
Triglycerides: 76 mg/dL (ref 0–149)
VLDL CHOLESTEROL CAL: 15 mg/dL (ref 5–40)

## 2016-10-01 LAB — TSH: TSH: 4.27 u[IU]/mL (ref 0.450–4.500)

## 2016-10-02 ENCOUNTER — Other Ambulatory Visit: Payer: Self-pay | Admitting: Nurse Practitioner

## 2016-10-02 DIAGNOSIS — E559 Vitamin D deficiency, unspecified: Secondary | ICD-10-CM | POA: Insufficient documentation

## 2016-10-02 MED ORDER — VITAMIN D (ERGOCALCIFEROL) 1.25 MG (50000 UNIT) PO CAPS: 50000.0000 [IU] | ORAL_CAPSULE | ORAL | 2 refills | Status: DC

## 2016-10-08 ENCOUNTER — Other Ambulatory Visit: Payer: Self-pay | Admitting: *Deleted

## 2016-10-08 ENCOUNTER — Encounter (HOSPITAL_COMMUNITY): Payer: BLUE CROSS/BLUE SHIELD

## 2016-10-08 ENCOUNTER — Ambulatory Visit (HOSPITAL_COMMUNITY)
Admission: RE | Admit: 2016-10-08 | Discharge: 2016-10-08 | Disposition: A | Discharge status: Home / Self Care | Payer: BLUE CROSS/BLUE SHIELD | Source: Ambulatory Visit | Attending: Nurse Practitioner | Admitting: Nurse Practitioner

## 2016-10-08 ENCOUNTER — Other Ambulatory Visit (HOSPITAL_COMMUNITY): Payer: BLUE CROSS/BLUE SHIELD

## 2016-10-08 DIAGNOSIS — N6320 Unspecified lump in the left breast, unspecified quadrant: Secondary | ICD-10-CM

## 2016-10-08 DIAGNOSIS — M85851 Other specified disorders of bone density and structure, right thigh: Secondary | ICD-10-CM | POA: Diagnosis not present

## 2016-10-08 DIAGNOSIS — Z780 Asymptomatic menopausal state: Secondary | ICD-10-CM | POA: Insufficient documentation

## 2016-10-08 DIAGNOSIS — M85832 Other specified disorders of bone density and structure, left forearm: Secondary | ICD-10-CM | POA: Insufficient documentation

## 2016-10-08 DIAGNOSIS — E559 Vitamin D deficiency, unspecified: Secondary | ICD-10-CM | POA: Insufficient documentation

## 2016-10-08 DIAGNOSIS — Z0000 Encounter for general adult medical examination without abnormal findings: Secondary | ICD-10-CM

## 2016-10-08 DIAGNOSIS — M4013 Other secondary kyphosis, cervicothoracic region: Secondary | ICD-10-CM | POA: Diagnosis not present

## 2016-10-08 LAB — IFOBT (OCCULT BLOOD): IFOBT: NEGATIVE

## 2016-10-15 ENCOUNTER — Telehealth: Payer: Self-pay | Admitting: Family Medicine

## 2016-10-15 NOTE — Telephone Encounter (Signed)
Pt is wanting to know the results of her bone density.

## 2016-10-17 NOTE — Telephone Encounter (Signed)
Pt is aware of all. 

## 2016-10-17 NOTE — Telephone Encounter (Signed)
Not sure if this was addressed. Bone density showed pre osteoporosis. Continue daily vitamin D and calcium. Weight bearing exercises such as walking. Repeat test in 2 years.

## 2016-10-20 ENCOUNTER — Other Ambulatory Visit: Payer: Self-pay | Admitting: Nurse Practitioner

## 2016-11-06 ENCOUNTER — Ambulatory Visit: Payer: BLUE CROSS/BLUE SHIELD | Admitting: Nurse Practitioner

## 2016-12-17 ENCOUNTER — Telehealth: Payer: Self-pay | Admitting: Family Medicine

## 2016-12-17 ENCOUNTER — Telehealth: Payer: Self-pay | Admitting: Nurse Practitioner

## 2016-12-17 NOTE — Telephone Encounter (Signed)
Patient said that her blood work showed that she was Vitamin D deficient in June of this year.  She said she has been feeling pretty weak and fatigued, like her legs cant hold her up.  She wants a nurse to call her back and discuss this.

## 2016-12-17 NOTE — Telephone Encounter (Signed)
Pt states she has been feeling really tired and both legs feel weak. Last time she felt this way she had bronchitis. Offered appt for tomorrow. No fever, no sob. Pt made appt for tomorrow. ED if worse.

## 2016-12-17 NOTE — Telephone Encounter (Signed)
Left message return call 12/17/16 

## 2016-12-17 NOTE — Telephone Encounter (Signed)
Patient is requesting a nurse to call her back regarding questions she has about her recent lab work.

## 2016-12-18 ENCOUNTER — Ambulatory Visit: Payer: BLUE CROSS/BLUE SHIELD | Admitting: Nurse Practitioner

## 2016-12-27 NOTE — Telephone Encounter (Signed)
Patient was wondering if Vit B level was checked on recent lab work- Patient states she has been experiencing fatigue lately. Patient states she started taking OTC Vit B12 and is currently feeling better but if the fatigue returns she will call back to request a Vit B12 level.

## 2017-01-17 ENCOUNTER — Other Ambulatory Visit: Payer: Self-pay | Admitting: Nurse Practitioner

## 2017-03-10 ENCOUNTER — Other Ambulatory Visit: Payer: Self-pay | Admitting: Nurse Practitioner

## 2017-03-26 ENCOUNTER — Ambulatory Visit: Payer: BLUE CROSS/BLUE SHIELD | Admitting: Nurse Practitioner

## 2017-04-17 ENCOUNTER — Other Ambulatory Visit: Payer: Self-pay | Admitting: Family Medicine

## 2017-04-17 ENCOUNTER — Other Ambulatory Visit: Payer: Self-pay | Admitting: Nurse Practitioner

## 2017-04-18 ENCOUNTER — Telehealth: Payer: Self-pay | Admitting: Family Medicine

## 2017-04-18 ENCOUNTER — Other Ambulatory Visit: Payer: Self-pay | Admitting: Nurse Practitioner

## 2017-04-18 MED ORDER — SERTRALINE HCL 100 MG PO TABS: 100.0000 mg | ORAL_TABLET | Freq: Every day | ORAL | 0 refills | Status: DC

## 2017-04-18 MED ORDER — BUSPIRONE HCL 10 MG PO TABS: ORAL_TABLET | ORAL | 0 refills | Status: DC

## 2017-04-18 MED ORDER — TRAZODONE HCL 100 MG PO TABS: 100.0000 mg | ORAL_TABLET | Freq: Every day | ORAL | 0 refills | Status: DC

## 2017-04-18 MED ORDER — CLONAZEPAM 0.5 MG PO TABS: ORAL_TABLET | ORAL | 0 refills | Status: DC

## 2017-04-18 NOTE — Telephone Encounter (Signed)
I called and left detailed message that we have sent in her medications as she had requested. Call if needs to.

## 2017-04-18 NOTE — Telephone Encounter (Signed)
Sent in one month refill on all meds. Follow up at end of the month as planned.

## 2017-04-18 NOTE — Telephone Encounter (Signed)
Requesting refill on sertraline, trazodone, buspirone, and clonazepam [facility] [place] [place] [facility].

## 2017-05-12 ENCOUNTER — Ambulatory Visit: Payer: Self-pay | Admitting: Nurse Practitioner

## 2017-05-15 ENCOUNTER — Other Ambulatory Visit: Payer: Self-pay | Admitting: Nurse Practitioner

## 2017-05-28 ENCOUNTER — Ambulatory Visit: Payer: Self-pay | Admitting: Nurse Practitioner

## 2017-06-14 ENCOUNTER — Other Ambulatory Visit: Payer: Self-pay | Admitting: Nurse Practitioner

## 2017-07-13 ENCOUNTER — Other Ambulatory Visit: Payer: Self-pay | Admitting: Nurse Practitioner

## 2017-07-14 ENCOUNTER — Other Ambulatory Visit: Payer: Self-pay | Admitting: Nurse Practitioner

## 2017-07-15 ENCOUNTER — Ambulatory Visit: Payer: Self-pay | Admitting: Nurse Practitioner

## 2017-07-23 ENCOUNTER — Ambulatory Visit: Payer: Self-pay | Admitting: Nurse Practitioner

## 2017-07-30 ENCOUNTER — Ambulatory Visit (INDEPENDENT_AMBULATORY_CARE_PROVIDER_SITE_OTHER): Payer: PRIVATE HEALTH INSURANCE | Admitting: Nurse Practitioner

## 2017-07-30 ENCOUNTER — Encounter: Payer: Self-pay | Admitting: Nurse Practitioner

## 2017-07-30 VITALS — BP 132/82 | Ht 67.0 in | Wt 213.6 lb

## 2017-07-30 DIAGNOSIS — K219 Gastro-esophageal reflux disease without esophagitis: Secondary | ICD-10-CM

## 2017-07-30 DIAGNOSIS — F411 Generalized anxiety disorder: Secondary | ICD-10-CM

## 2017-07-30 DIAGNOSIS — F329 Major depressive disorder, single episode, unspecified: Secondary | ICD-10-CM | POA: Diagnosis not present

## 2017-07-30 DIAGNOSIS — F32A Depression, unspecified: Secondary | ICD-10-CM

## 2017-07-30 MED ORDER — PANTOPRAZOLE SODIUM 40 MG PO TBEC: DELAYED_RELEASE_TABLET | ORAL | 2 refills | Status: DC

## 2017-07-31 ENCOUNTER — Encounter: Payer: Self-pay | Admitting: Nurse Practitioner

## 2017-07-31 NOTE — Progress Notes (Signed)
Subjective: Presents for recheck on her anxiety.  Has had increase stress lately due to some family issues.  Compliant with medications.  Defers any changes in dosing on her regular meds at this time.  Takes occasional Klonopin.  Does not take it every day.  Has been taking famotidine for reflux but has noticed an exacerbation lately.  Has not been taking Protonix.  Some caffeine intake.  Non-smoker.  No alcohol use.  No excessive NSAID use.  Objective:   BP 132/82   Ht 5\' 7"  (1.702 m)   Wt 213 lb 9.6 oz (96.9 kg)   BMI 33.45 kg/m  NAD.  Alert, oriented.  Cheerful affect.  Thoughts logical coherent and relevant.  Dressed appropriately.  Making good eye contact.  Lungs clear.  Heart regular rate and rhythm.  Abdomen soft nondistended with mild epigastric area tenderness.  Assessment:   Problem List Items Addressed This Visit      Digestive   Esophageal reflux   Relevant Medications   pantoprazole (PROTONIX) 40 MG tablet     Other   Depression   Generalized anxiety disorder - Primary       Plan:   Meds ordered this encounter  Medications  . pantoprazole (PROTONIX) 40 MG tablet    Sig: TAKE 1 TABLET(40 MG) BY MOUTH DAILY PRN ACID REFLUX    Dispense:  30 tablet    Refill:  2    Order Specific Question:   Supervising Provider    Answer:   Merlyn AlbertLUKING, WILLIAM S [[id number]]   Hold on famotidine.  Switch back to Protonix for the next month.  Call back at that time if symptoms have not resolved.  If symptoms are under control, stop Protonix and switch back to famotidine.  Continue other medications as directed.  Reminded about physical this summer.

## 2017-08-11 ENCOUNTER — Other Ambulatory Visit: Payer: Self-pay | Admitting: Family Medicine

## 2017-08-11 ENCOUNTER — Other Ambulatory Visit: Payer: Self-pay | Admitting: Nurse Practitioner

## 2017-10-12 ENCOUNTER — Other Ambulatory Visit: Payer: Self-pay | Admitting: Nurse Practitioner

## 2017-10-17 ENCOUNTER — Other Ambulatory Visit: Payer: Self-pay | Admitting: Nurse Practitioner

## 2017-10-17 ENCOUNTER — Other Ambulatory Visit: Payer: Self-pay | Admitting: Family Medicine

## 2017-12-12 ENCOUNTER — Other Ambulatory Visit: Payer: Self-pay | Admitting: *Deleted

## 2017-12-12 MED ORDER — TRAZODONE HCL 100 MG PO TABS: 100.0000 mg | ORAL_TABLET | Freq: Every day | ORAL | 0 refills | Status: DC

## 2018-01-07 ENCOUNTER — Other Ambulatory Visit: Payer: Self-pay | Admitting: Family Medicine

## 2018-02-04 ENCOUNTER — Other Ambulatory Visit: Payer: Self-pay | Admitting: Family Medicine

## 2018-02-05 ENCOUNTER — Other Ambulatory Visit: Payer: Self-pay

## 2018-02-05 ENCOUNTER — Telehealth: Payer: Self-pay | Admitting: Family Medicine

## 2018-02-05 MED ORDER — SERTRALINE HCL 100 MG PO TABS: ORAL_TABLET | ORAL | 0 refills | Status: DC

## 2018-02-05 MED ORDER — CLONAZEPAM 0.5 MG PO TABS: ORAL_TABLET | ORAL | 0 refills | Status: DC

## 2018-02-05 MED ORDER — BUSPIRONE HCL 10 MG PO TABS: ORAL_TABLET | ORAL | 0 refills | Status: DC

## 2018-02-05 NOTE — Telephone Encounter (Signed)
30 d only needs o v 

## 2018-02-05 NOTE — Telephone Encounter (Signed)
Fax from pharmacy requesting refills on the following medications: Buspirone 10 mg tablet; take one tablet po twice daily Sertraline 100 mg tablet; take one tablet po every day Clonazepam 0.5 mg tablet; take one tablet po every night at bedtime as needed for anxiety.

## 2018-02-05 NOTE — Telephone Encounter (Signed)
Refills sent in. Klonopin script printed and awaiting signature.

## 2018-02-05 NOTE — Telephone Encounter (Signed)
Prescription signed and faxed to pharmacy.

## 2018-03-03 ENCOUNTER — Other Ambulatory Visit: Payer: Self-pay | Admitting: Family Medicine

## 2018-03-03 NOTE — Telephone Encounter (Signed)
See my 10 24 noter. Call pt, sched visit, when she does, one mo ref all

## 2018-03-03 NOTE — Telephone Encounter (Signed)
Left message to return call 

## 2018-03-04 ENCOUNTER — Other Ambulatory Visit: Payer: Self-pay

## 2018-03-04 MED ORDER — PANTOPRAZOLE SODIUM 40 MG PO TBEC: DELAYED_RELEASE_TABLET | ORAL | 2 refills | Status: DC

## 2018-03-04 NOTE — Telephone Encounter (Signed)
plz see my note from 20 hrs ago within this thread

## 2018-03-04 NOTE — Telephone Encounter (Signed)
Pt has appt scheduled for 12/19.   Refills needed:  clonazePAM (KLONOPIN) 0.5 MG tablet busPIRone (BUSPAR) 10 MG tablet  sertraline (ZOLOFT) 100 MG tablet  pantoprazole (PROTONIX) 40 MG tablet traZODone (DESYREL) 100 MG tablet  [id number] - [place], [place] - [address] [facility] [address]

## 2018-03-05 NOTE — Telephone Encounter (Signed)
One mo all 

## 2018-04-02 ENCOUNTER — Encounter (INDEPENDENT_AMBULATORY_CARE_PROVIDER_SITE_OTHER): Payer: Self-pay | Admitting: *Deleted

## 2018-04-02 ENCOUNTER — Encounter: Payer: Self-pay | Admitting: Family Medicine

## 2018-04-02 ENCOUNTER — Ambulatory Visit (INDEPENDENT_AMBULATORY_CARE_PROVIDER_SITE_OTHER): Payer: PRIVATE HEALTH INSURANCE | Admitting: Family Medicine

## 2018-04-02 VITALS — BP 120/72 | Temp 98.5°F | Ht 67.0 in | Wt 213.2 lb

## 2018-04-02 DIAGNOSIS — R109 Unspecified abdominal pain: Secondary | ICD-10-CM | POA: Diagnosis not present

## 2018-04-02 DIAGNOSIS — F411 Generalized anxiety disorder: Secondary | ICD-10-CM | POA: Diagnosis not present

## 2018-04-02 DIAGNOSIS — F32A Depression, unspecified: Secondary | ICD-10-CM

## 2018-04-02 DIAGNOSIS — G4700 Insomnia, unspecified: Secondary | ICD-10-CM

## 2018-04-02 DIAGNOSIS — F329 Major depressive disorder, single episode, unspecified: Secondary | ICD-10-CM

## 2018-04-02 DIAGNOSIS — Z1211 Encounter for screening for malignant neoplasm of colon: Secondary | ICD-10-CM

## 2018-04-02 LAB — POCT URINALYSIS DIPSTICK
Spec Grav, UA: <=1.005 — AB (ref 1.010–1.025)
pH, UA: 6 (ref 5.0–8.0)

## 2018-04-02 MED ORDER — PANTOPRAZOLE SODIUM 40 MG PO TBEC: DELAYED_RELEASE_TABLET | ORAL | 5 refills | Status: DC

## 2018-04-02 MED ORDER — CLONAZEPAM 0.5 MG PO TABS: ORAL_TABLET | ORAL | 5 refills | Status: DC

## 2018-04-02 MED ORDER — BUSPIRONE HCL 10 MG PO TABS: ORAL_TABLET | ORAL | 5 refills | Status: DC

## 2018-04-02 MED ORDER — SERTRALINE HCL 100 MG PO TABS: ORAL_TABLET | ORAL | 5 refills | Status: DC

## 2018-04-02 NOTE — Progress Notes (Signed)
   Subjective:    Patient ID: Julie Campos, female    DOB: [birth year], [age] y.o.   MRN: [id number]  Anxiety  Presents for follow-up visit.   Compliance with medications: yes.  takes klonopin.   Pain on right lower side. 2 weeks ago. Pain comes and goes.  Nausea off and on for 2 weeks. Took zofran.    Right lower abd discomfort  Near the hip  Mentioned to chiropractor    Movement does not affect the discmfort    Pt was possibly liftins some around this time    Got better for awhile and now  Acting up again today    Colnoscopy, pt states has never had                                          Patient compliant with insomnia medication. Generally takes most nights. No obvious morning drowsiness. Definitely helps patient sleep. Without it patient states would not get a good nights rest.        +  Patient notes ongoing compliance with antidepressant medication. No obvious side effects. Reports does not miss a dose. Overall continues to help depression substantially. No thoughts of homicide or suicide. Would like to maintain medication.  Pos hx of chronic anxiety, theis summer had a lot to stress,  Review of Systems No headache, no major weight loss or weight gain, no chest pain no back pain abdominal pain no change in bowel habits complete ROS otherwise negative     Objective:   Physical Exam  Alert and oriented, vitals reviewed and stable, NAD ENT-TM's and ext canals WNL bilat via otoscopic exam Soft palate, tonsils and post pharynx WNL via oropharyngeal exam Neck-symmetric, no masses; thyroid nonpalpable and nontender Pulmonary-no tachypnea or accessory muscle use; Clear without wheezes via auscultation Card--no abnrml murmurs, rhythm reg and rate WNL Carotid pulses symmetric, without bruits Abdominal exam benign no focal tenderness.  Excellent bowel sounds no masses.  Positive tenderness near the right pelvic  prominence      Assessment & Plan:  Impression 1 generalized anxiety disorder with element of depression.  Clinically stable.  Discussed to maintain same meds  2.  Insomnia.  Ongoing with need for chronic meds meds refilled  3.  Probable subacute lateral abdominal strain.  Patient reports occurred after she was lifting some heavy pots.  However this led to discussion.  She has never had colonoscopy.  Strongly encourage patient we will press on do it  Recommend wellness exam with Mardella LaymanLindsey at one point encouraged also

## 2018-04-07 ENCOUNTER — Other Ambulatory Visit: Payer: Self-pay | Admitting: Family Medicine

## 2018-04-07 NOTE — Telephone Encounter (Signed)
Six mo worth 

## 2018-04-10 ENCOUNTER — Other Ambulatory Visit: Payer: Self-pay | Admitting: Family Medicine

## 2018-04-10 NOTE — Telephone Encounter (Signed)
Six mo ok 

## 2018-04-29 ENCOUNTER — Other Ambulatory Visit: Payer: Self-pay | Admitting: Family Medicine

## 2018-05-07 ENCOUNTER — Encounter: Payer: PRIVATE HEALTH INSURANCE | Admitting: Family Medicine

## 2018-05-13 ENCOUNTER — Other Ambulatory Visit: Payer: Self-pay | Admitting: Family Medicine

## 2018-05-14 ENCOUNTER — Other Ambulatory Visit: Payer: Self-pay

## 2018-05-14 NOTE — Telephone Encounter (Signed)
Six mo worth 

## 2018-08-01 ENCOUNTER — Other Ambulatory Visit: Payer: Self-pay | Admitting: Family Medicine

## 2018-08-20 ENCOUNTER — Other Ambulatory Visit: Payer: Self-pay | Admitting: Family Medicine

## 2018-10-01 ENCOUNTER — Other Ambulatory Visit: Payer: Self-pay | Admitting: Family Medicine

## 2018-10-06 ENCOUNTER — Other Ambulatory Visit: Payer: Self-pay | Admitting: Family Medicine

## 2018-11-03 ENCOUNTER — Other Ambulatory Visit: Payer: Self-pay | Admitting: Family Medicine

## 2018-11-03 ENCOUNTER — Other Ambulatory Visit: Payer: Self-pay | Admitting: Nurse Practitioner

## 2018-11-03 NOTE — Telephone Encounter (Signed)
One mo ea sched visit with carolyn early next mo (carolyn refilled anxiety meds three wks ago I see no note

## 2018-11-07 NOTE — Telephone Encounter (Signed)
Six mo worth 

## 2018-12-03 ENCOUNTER — Other Ambulatory Visit: Payer: Self-pay | Admitting: Family Medicine

## 2018-12-30 ENCOUNTER — Telehealth: Payer: Self-pay | Admitting: Family Medicine

## 2018-12-30 NOTE — Telephone Encounter (Signed)
rec ov first

## 2018-12-30 NOTE — Telephone Encounter (Signed)
Patient states she has been tired/fatigue for months ,not sleeping well. She has been taking her vitamin-D but still tired and wanting labs done to figure out whats going on. Last seen 04/02/18 Please Advise

## 2018-12-31 NOTE — Telephone Encounter (Signed)
Left message to return call 

## 2018-12-31 NOTE — Telephone Encounter (Signed)
Discussed with pt and transferred to the front to schedule.

## 2019-01-04 ENCOUNTER — Other Ambulatory Visit: Payer: Self-pay | Admitting: Family Medicine

## 2019-01-07 IMAGING — US ULTRASOUND LEFT BREAST LIMITED
1 series · 10 of 10 positions shown · non-contrast
Comparison: Previous exam(s).

CLINICAL DATA: Screening recall for possible left breast mass.

EXAM:
DIGITAL DIAGNOSTIC LEFT MAMMOGRAM WITH CAD
ULTRASOUND LEFT BREAST

[Series 1: ultrasound left breast limited · 0.07mm/px · 10 of 10 slices shown]
[im 1/10]
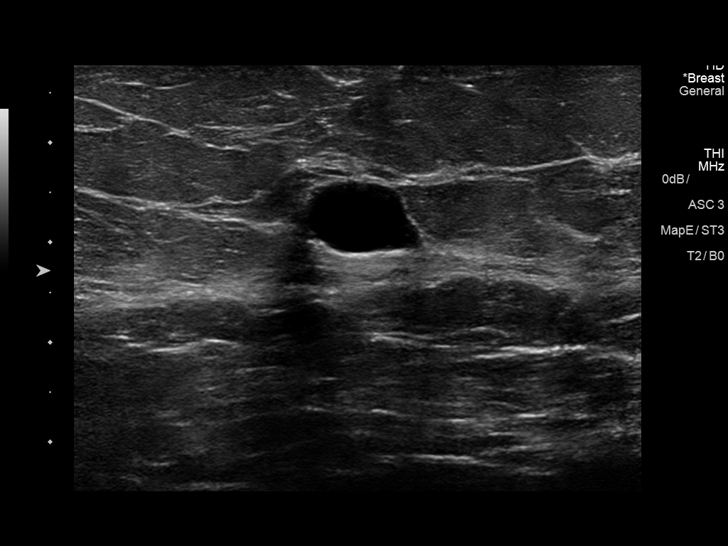
[im 2/10]
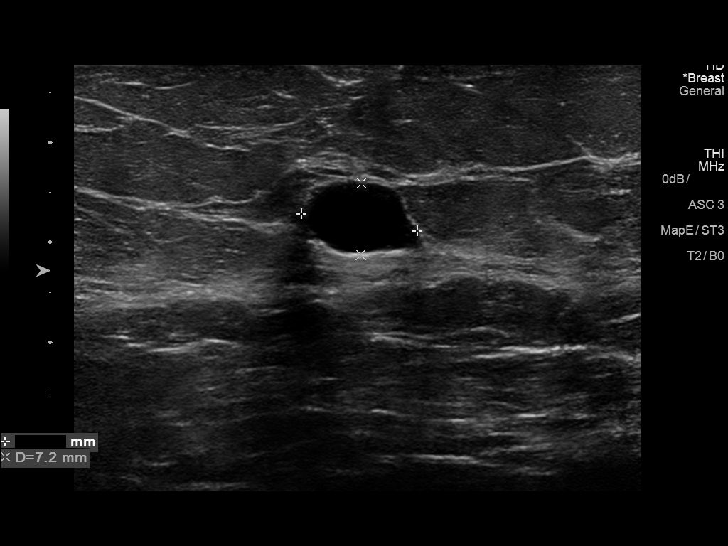
[im 3/10]
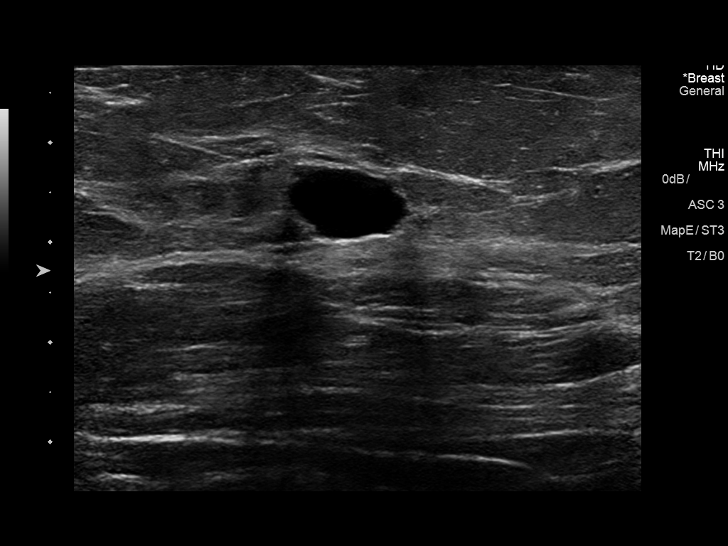
[im 4/10]
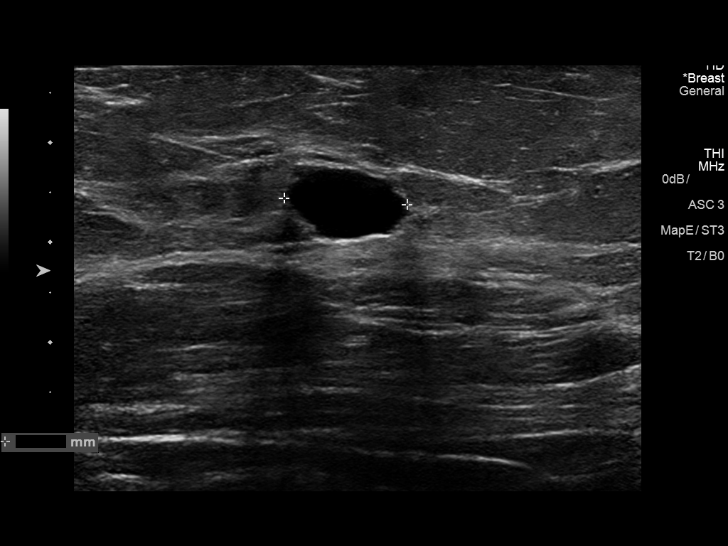
[im 5/10]
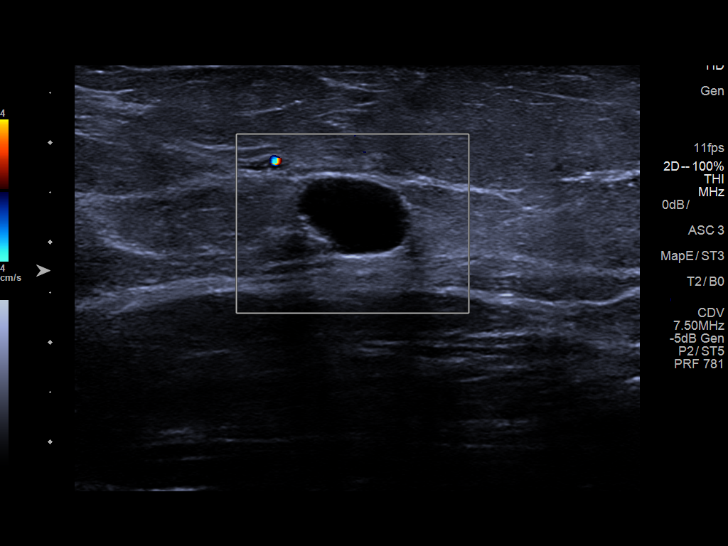
[im 6/10]
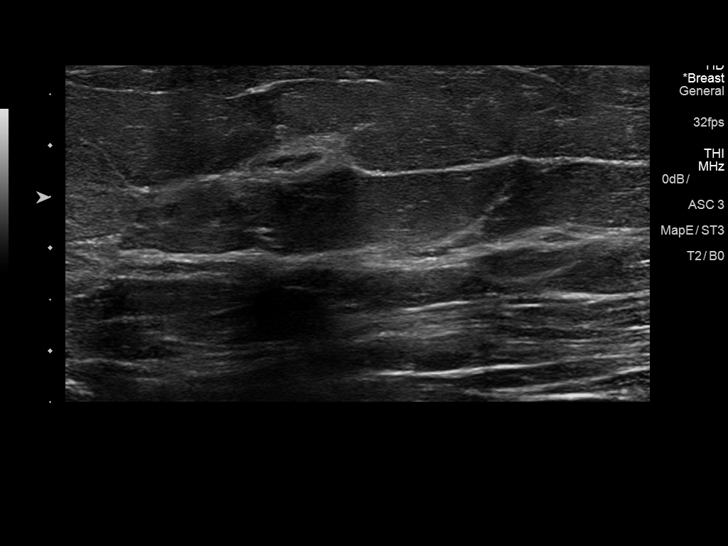
[im 7/10]
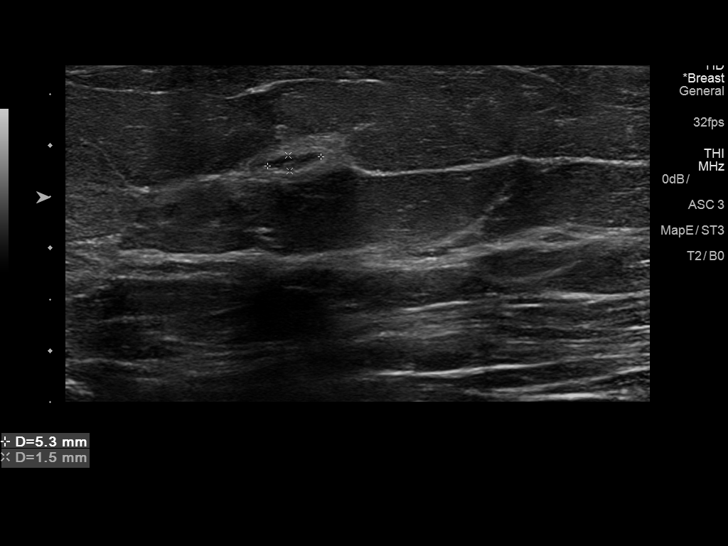
[im 8/10]
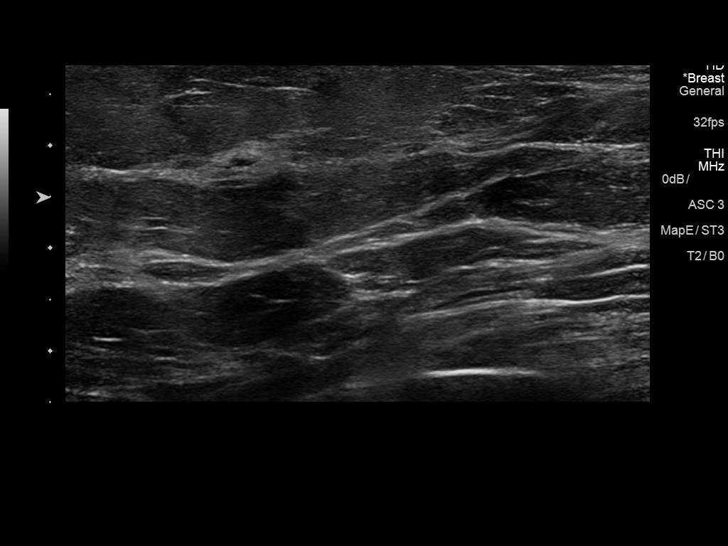
[im 9/10]
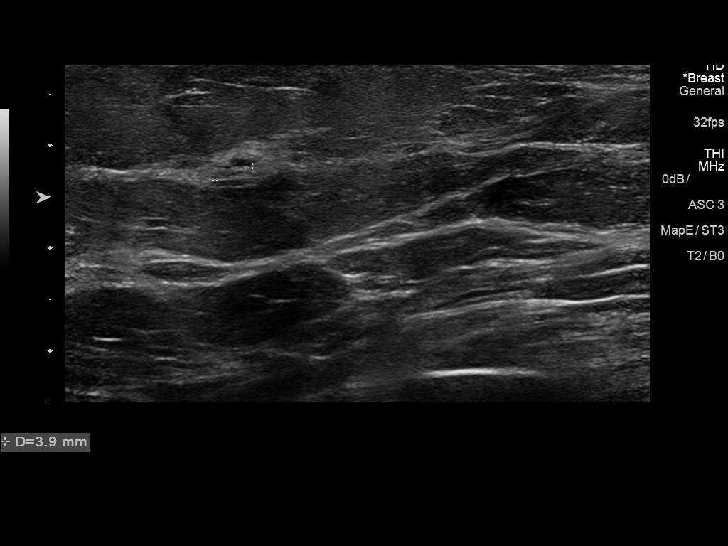
[im 10/10]
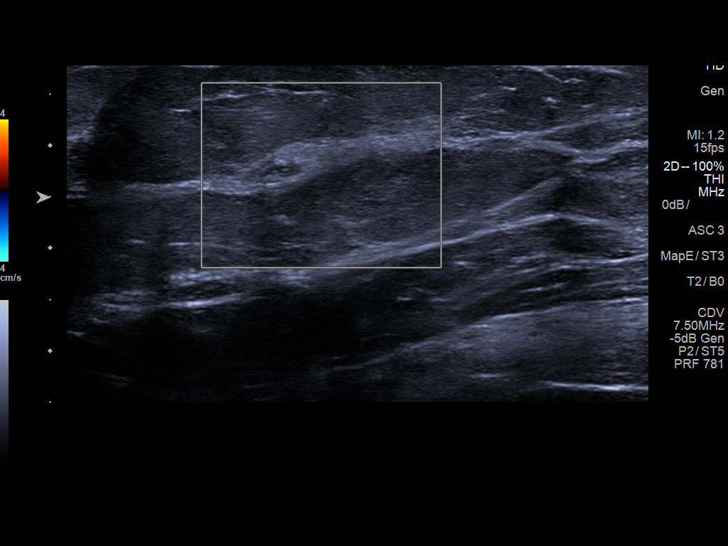

[10 of 10 positions shown; findings below may reference images not displayed]

ACR Breast Density Category b: There are scattered areas of
fibroglandular density.
FINDINGS: Cc and MLO tomograms were performed of the left breast demonstrating
in oval circumscribed mass in the upper to central left breast
measuring approximately 1.1 cm. In addition, there is a small oval
mass in the outer left breast measuring approximately 0.4 cm.

Mammographic images were processed with CAD.

Targeted ultrasound of left breast was performed demonstrating a
simple cyst at 11 o'clock 1 cm from the nipple measuring 1.2 x 0.7 x
1.2 cm. This corresponds well with the mass seen at mammography. In
addition, there is a small intramammary lymph node at 2 o'clock 3 cm
from nipple measuring 0.5 x 0.2 x 0.4 cm corresponding well with the
mass seen in the outer left breast at mammography.
IMPRESSION: No findings of malignancy in the left breast.

RECOMMENDATION:
Screening mammogram in one year.(Code:[id number])

I have discussed the findings and recommendations with the patient.
Results were also provided in writing at the conclusion of the
visit. If applicable, a reminder letter will be sent to the patient
regarding the next appointment.

BI-RADS CATEGORY  2: Benign.

## 2019-01-12 ENCOUNTER — Other Ambulatory Visit: Payer: Self-pay

## 2019-01-12 ENCOUNTER — Ambulatory Visit (INDEPENDENT_AMBULATORY_CARE_PROVIDER_SITE_OTHER): Payer: PRIVATE HEALTH INSURANCE | Admitting: Family Medicine

## 2019-01-12 DIAGNOSIS — Z13220 Encounter for screening for lipoid disorders: Secondary | ICD-10-CM | POA: Diagnosis not present

## 2019-01-12 DIAGNOSIS — Z131 Encounter for screening for diabetes mellitus: Secondary | ICD-10-CM

## 2019-01-12 DIAGNOSIS — F411 Generalized anxiety disorder: Secondary | ICD-10-CM

## 2019-01-12 DIAGNOSIS — R5383 Other fatigue: Secondary | ICD-10-CM

## 2019-01-12 DIAGNOSIS — G4700 Insomnia, unspecified: Secondary | ICD-10-CM

## 2019-01-12 DIAGNOSIS — R109 Unspecified abdominal pain: Secondary | ICD-10-CM

## 2019-01-12 MED ORDER — SERTRALINE HCL 100 MG PO TABS: 100.0000 mg | ORAL_TABLET | Freq: Every day | ORAL | 5 refills | Status: DC

## 2019-01-12 MED ORDER — FAMOTIDINE 20 MG PO TABS: 20.0000 mg | ORAL_TABLET | Freq: Every day | ORAL | 5 refills | Status: DC

## 2019-01-12 MED ORDER — CLONAZEPAM 1 MG PO TABS: ORAL_TABLET | ORAL | 5 refills | Status: DC

## 2019-01-12 NOTE — Progress Notes (Signed)
   Subjective:  Audio plus video patient contacts with numerous concerns  Patient ID: Julie Campos, female    DOB: Sep 08, 1955, 63 y.o.   MRN: [id number]  HPI  Patient calls to discuss fatigue and inability to sleep for months. Patient would like blood work. Patient also reports nausea for the last 4 days.  Virtual Visit via Video Note  I connected with Julie Campos on 01/12/19 at  3:00 PM EDT by a video enabled telemedicine application and verified that I am speaking with the correct person using two identifiers.  Location: Patient: home Provider: office   I discussed the limitations of evaluation and management by telemedicine and the availability of in person appointments. The patient expressed understanding and agreed to proceed.  History of Present Illness:    Observations/Objective:   Assessment and Plan:   Follow Up Instructions:    I discussed the assessment and treatment plan with the patient. The patient was provided an opportunity to ask questions and all were answered. The patient agreed with the plan and demonstrated an understanding of the instructions.   The patient was advised to call back or seek an in-person evaluation if the symptoms worsen or if the condition fails to improve as anticipated.  I provided 28 minutes of non-face-to-face time during this encounter. Patient notes profound fatigue.  Tiredness.  No energy.  Feels her depression tendencies overall are stable.  As is her anxiety.  States she has handled the pandemic relatively well.  Compliant with her mental health medicines.  Reports further trouble with sleep at night.  Stop the trazodone.  Made her feel too shaky.  Patient notes her sleep schedule is all messed up often stays up till 3 or 6 in the morning.  Then turns around and sleeps until the mid afternoon.  Recognizes that this is not functional     Review of Systems No headache, no major weight loss or weight gain, no  chest pain no back pain abdominal pain no change in bowel habits complete ROS otherwise negative     Objective:   Physical Exam Virtual       Assessment & Plan:  Impression fatigue.  Fairly profound.  Substantial.  Patient request broad blood work which I think is a good idea.  2.  Depression/anxiety clinically stable to maintain same meds.  Will stop trazodone  3.  Insomnia worsening.  Increase clonazepam to 1.0 mg proper sleep hygiene discussed and encouraged  Exercise discussed and encouraged at length.  Appropriate blood work medications refilled follow-up in 6 months

## 2019-01-18 ENCOUNTER — Telehealth: Payer: Self-pay | Admitting: Family Medicine

## 2019-01-18 NOTE — Telephone Encounter (Signed)
Please advise. Thank you

## 2019-01-18 NOTE — Telephone Encounter (Signed)
Patient had phone visit on 9/28 and forgot to ask for refill on ondansetron 8 mg tablets called into [facility]

## 2019-01-19 MED ORDER — ONDANSETRON 8 MG PO TBDP: ORAL_TABLET | ORAL | 2 refills | Status: DC

## 2019-01-19 NOTE — Telephone Encounter (Signed)
Sure numb 20 one q 8 hrs prn nausea plu s two ref

## 2019-01-19 NOTE — Telephone Encounter (Signed)
Prescription sent electronically to pharmacy. Left message to return call to notify patient. 

## 2019-01-20 LAB — TSH: TSH: 3.01 u[IU]/mL (ref 0.450–4.500)

## 2019-01-20 LAB — LIPID PANEL
Chol/HDL Ratio: 4.8 ratio — ABNORMAL HIGH (ref 0.0–4.4)
Cholesterol, Total: 218 mg/dL — ABNORMAL HIGH (ref 100–199)
HDL: 45 mg/dL (ref >39)
LDL Chol Calc (NIH): 157 mg/dL — ABNORMAL HIGH (ref 0–99)
Triglycerides: 90 mg/dL (ref 0–149)
VLDL Cholesterol Cal: 16 mg/dL (ref 5–40)

## 2019-01-20 LAB — CBC WITH DIFFERENTIAL/PLATELET
Basophils Absolute: 0 10*3/uL (ref 0.0–0.2)
Basos: 1 %
EOS (ABSOLUTE): 0.1 10*3/uL (ref 0.0–0.4)
Eos: 3 %
Hematocrit: 38.2 % (ref 34.0–46.6)
Hemoglobin: 12.7 g/dL (ref 11.1–15.9)
Immature Grans (Abs): 0 10*3/uL (ref 0.0–0.1)
Immature Granulocytes: 0 %
Lymphocytes Absolute: 1.5 10*3/uL (ref 0.7–3.1)
Lymphs: 43 %
MCH: 31.3 pg (ref 26.6–33.0)
MCHC: 33.2 g/dL (ref 31.5–35.7)
MCV: 94 fL (ref 79–97)
Monocytes Absolute: 0.3 10*3/uL (ref 0.1–0.9)
Monocytes: 8 %
Neutrophils Absolute: 1.6 10*3/uL (ref 1.4–7.0)
Neutrophils: 45 %
Platelets: 317 10*3/uL (ref 150–450)
RBC: 4.06 x10E6/uL (ref 3.77–5.28)
RDW: 11.8 % (ref 11.7–15.4)
WBC: 3.6 10*3/uL (ref 3.4–10.8)

## 2019-01-20 LAB — BASIC METABOLIC PANEL
BUN/Creatinine Ratio: 15 (ref 12–28)
BUN: 10 mg/dL (ref 8–27)
CO2: 25 mmol/L (ref 20–29)
Calcium: 9.2 mg/dL (ref 8.7–10.3)
Chloride: 101 mmol/L (ref 96–106)
Creatinine, Ser: 0.67 mg/dL (ref 0.57–1.00)
GFR calc Af Amer: 110 mL/min/{1.73_m2} (ref >59)
GFR calc non Af Amer: 95 mL/min/{1.73_m2} (ref >59)
Glucose: 96 mg/dL (ref 65–99)
Potassium: 4.5 mmol/L (ref 3.5–5.2)
Sodium: 139 mmol/L (ref 134–144)

## 2019-01-20 LAB — HEPATIC FUNCTION PANEL
ALT: 13 IU/L (ref 0–32)
AST: 15 IU/L (ref 0–40)
Albumin: 4.3 g/dL (ref 3.8–4.8)
Alkaline Phosphatase: 107 IU/L (ref 39–117)
Bilirubin Total: 0.6 mg/dL (ref 0.0–1.2)
Bilirubin, Direct: 0.11 mg/dL (ref 0.00–0.40)
Total Protein: 6.9 g/dL (ref 6.0–8.5)

## 2019-01-20 LAB — VITAMIN D 25 HYDROXY (VIT D DEFICIENCY, FRACTURES): Vit D, 25-Hydroxy: 29.5 ng/mL — ABNORMAL LOW (ref 30.0–100.0)

## 2019-01-20 NOTE — Telephone Encounter (Signed)
Patient notified

## 2019-01-25 ENCOUNTER — Encounter: Payer: Self-pay | Admitting: Family Medicine

## 2019-01-26 ENCOUNTER — Telehealth: Payer: Self-pay | Admitting: Family Medicine

## 2019-01-26 NOTE — Telephone Encounter (Signed)
Contacted patient. Pt informed that provider sent out a letter regarding results. Read what letter said to patient. Pt verbalized understanding.

## 2019-01-26 NOTE — Telephone Encounter (Signed)
Pt calling to request a call back once the doctor has reviewed her lab results

## 2019-01-28 ENCOUNTER — Encounter: Payer: Self-pay | Admitting: Internal Medicine

## 2019-02-17 ENCOUNTER — Ambulatory Visit: Payer: BLUE CROSS/BLUE SHIELD | Admitting: Gastroenterology

## 2019-03-31 ENCOUNTER — Other Ambulatory Visit: Payer: Self-pay | Admitting: Family Medicine

## 2019-04-02 ENCOUNTER — Other Ambulatory Visit: Payer: Self-pay | Admitting: Family Medicine

## 2019-05-02 ENCOUNTER — Other Ambulatory Visit: Payer: Self-pay | Admitting: Family Medicine

## 2019-05-03 NOTE — Telephone Encounter (Signed)
Last med check 01/12/19 

## 2019-05-20 ENCOUNTER — Encounter: Payer: Self-pay | Admitting: Family Medicine

## 2019-05-29 ENCOUNTER — Other Ambulatory Visit: Payer: Self-pay | Admitting: Family Medicine

## 2019-06-02 ENCOUNTER — Other Ambulatory Visit: Payer: Self-pay | Admitting: Family Medicine

## 2019-06-04 ENCOUNTER — Other Ambulatory Visit: Payer: Self-pay | Admitting: Family Medicine

## 2019-06-04 NOTE — Telephone Encounter (Signed)
Actually sept 2020. May ref times 90 d, ov before foinished

## 2019-06-04 NOTE — Telephone Encounter (Signed)
Last seen for anxiety dec 2019

## 2019-06-27 ENCOUNTER — Other Ambulatory Visit: Payer: Self-pay | Admitting: Family Medicine

## 2019-06-28 NOTE — Telephone Encounter (Signed)
No. Pts b w showed virtually normal vit d last fall, at that time I rec she take otc vit d3 2000 milliunits every day of her life long term. No need for hi dose vit d rx's

## 2019-06-28 NOTE — Telephone Encounter (Signed)
Left message to return call 

## 2019-07-01 ENCOUNTER — Other Ambulatory Visit: Payer: Self-pay | Admitting: Family Medicine

## 2019-07-02 ENCOUNTER — Other Ambulatory Visit: Payer: Self-pay | Admitting: Family Medicine

## 2019-07-08 ENCOUNTER — Ambulatory Visit: Payer: PRIVATE HEALTH INSURANCE | Admitting: Family Medicine

## 2019-07-28 ENCOUNTER — Other Ambulatory Visit: Payer: Self-pay | Admitting: Family Medicine

## 2019-08-24 ENCOUNTER — Other Ambulatory Visit: Payer: Self-pay | Admitting: Family Medicine

## 2019-08-26 ENCOUNTER — Telehealth: Payer: Self-pay | Admitting: Family Medicine

## 2019-08-26 ENCOUNTER — Telehealth (INDEPENDENT_AMBULATORY_CARE_PROVIDER_SITE_OTHER): Payer: PRIVATE HEALTH INSURANCE | Admitting: Family Medicine

## 2019-08-26 ENCOUNTER — Other Ambulatory Visit: Payer: Self-pay

## 2019-08-26 DIAGNOSIS — F411 Generalized anxiety disorder: Secondary | ICD-10-CM | POA: Diagnosis not present

## 2019-08-26 DIAGNOSIS — R5383 Other fatigue: Secondary | ICD-10-CM

## 2019-08-26 DIAGNOSIS — Z1321 Encounter for screening for nutritional disorder: Secondary | ICD-10-CM | POA: Diagnosis not present

## 2019-08-26 MED ORDER — BUSPIRONE HCL 10 MG PO TABS: 10.0000 mg | ORAL_TABLET | Freq: Two times a day (BID) | ORAL | 5 refills | Status: DC

## 2019-08-26 MED ORDER — SERTRALINE HCL 100 MG PO TABS: ORAL_TABLET | ORAL | 5 refills | Status: DC

## 2019-08-26 MED ORDER — CLONAZEPAM 0.5 MG PO TABS: ORAL_TABLET | ORAL | 5 refills | Status: DC

## 2019-08-26 MED ORDER — PANTOPRAZOLE SODIUM 40 MG PO TBEC: DELAYED_RELEASE_TABLET | ORAL | 1 refills | Status: DC

## 2019-08-26 MED ORDER — FAMOTIDINE 20 MG PO TABS: ORAL_TABLET | ORAL | 1 refills | Status: DC

## 2019-08-26 NOTE — Telephone Encounter (Signed)
Ms. theadora, noyes are scheduled for a virtual visit with your provider today.    Just as we do with appointments in the office, we must obtain your consent to participate.  Your consent will be active for this visit and any virtual visit you may have with one of our providers in the next 365 days.    If you have a MyChart account, I can also send a copy of this consent to you electronically.  All virtual visits are billed to your insurance company just like a traditional visit in the office.  As this is a virtual visit, video technology does not allow for your provider to perform a traditional examination.  This may limit your provider's ability to fully assess your condition.  If your provider identifies any concerns that need to be evaluated in person or the need to arrange testing such as labs, EKG, etc, we will make arrangements to do so.    Although advances in technology are sophisticated, we cannot ensure that it will always work on either your end or our end.  If the connection with a video visit is poor, we may have to switch to a telephone visit.  With either a video or telephone visit, we are not always able to ensure that we have a secure connection.   I need to obtain your verbal consent now.   Are you willing to proceed with your visit today?   Julie Campos has provided verbal consent on 08/26/2019 for a virtual visit (video or telephone).   Marlowe Shores, LPN 5/32/9924  2:68 PM

## 2019-08-26 NOTE — Progress Notes (Signed)
   Subjective:    Patient ID: Julie Campos, female    DOB: 1955/07/21, 64 y.o.   MRN: [id number]  HPI Pt is having extreme fatigue. Pt states this has been going on for a while but has become worse in the past month. Pt is not sleeping well but takes Klonopin 0.5 mg and that does help. Pt has had to push herself through to get stuff done. Pt had massage from chiropractor yesterday and the massage tool had vibration. Pt states that last night she began to have aches in legs and is not sure if the massage caused them. Also had chills last night. No temp. Pt not sure if her iron or Vit D level could be low.  Pt has lost 20 pounds since going on a diet with her husband.  Virtual Visit via Video Note  I connected with Julie Campos on 08/26/19 at  2:30 PM EDT by a video enabled telemedicine application and verified that I am speaking with the correct person using two identifiers.  Location: Patient: home Provider: office   I discussed the limitations of evaluation and management by telemedicine and the availability of in person appointments. The patient expressed understanding and agreed to proceed.  History of Present Illness:    Observations/Objective:   Assessment and Plan:   Follow Up Instructions:    I discussed the assessment and treatment plan with the patient. The patient was provided an opportunity to ask questions and all were answered. The patient agreed with the plan and demonstrated an understanding of the instructions.   The patient was advised to call back or seek an in-person evaluation if the symptoms worsen or if the condition fails to improve as anticipated.  I provided 30 minutes of non-face-to-face time during this encounter.     Review of Systems No headache no chest pain no shortness of breath    Objective:   Physical Exam  Alert vitals stable, NAD. Blood pressure good on repeat. HEENT normal. Lungs clear. Heart regular rate and  rhythm.       Assessment & Plan:  I #1 generalized fatigue.  Etiology unclear.  Will need to do some blood work.  Strongly encourage resumption of exercise.  Also strongly encourage wellness exam none done since 2018.  2.  Generalized anxiety disorder with element of depression.  Discussed with patient.  Overall good control.  Medications refilled  3.  Insomnia.  Medicine  Further recommendations based on blood work.  Wellness this summer encouraged   2.

## 2019-08-26 NOTE — Telephone Encounter (Signed)
Patient called and needs a refill on her pantoprazole (PROTONIX) 40 MG tablet  And also wanted to know if she can get the Vitamin D as wellor does she need lab work? Patient does not want to have her thyroid tested again on the next labs if it was ok. She will have to pay out of pocket.

## 2019-08-26 NOTE — Telephone Encounter (Signed)
Protonix sent to pharmacy and pt is aware. Informed patient that once we get blood work results back we will know what direction to go in with Vitamin D and that provider did not order any thyroid testing. Pt verbalized understanding.

## 2019-08-30 ENCOUNTER — Telehealth: Payer: Self-pay | Admitting: Family Medicine

## 2019-08-30 NOTE — Telephone Encounter (Signed)
Pt contacted and states that she is feeling about the same as she was when she had visit with provider on Thursday. Pt states she is on her way to have blood work done. Informed patient that if she was having new symptoms she would need to have a virtual visit with Eber Jones or Dr.Taylor. Pt states she is going to have lab work done now and see what the results are for that.

## 2019-08-30 NOTE — Telephone Encounter (Signed)
Nurse spk with pt. If she is having new symtoms of chills aching and headache, needs to do virt visit with carolyn or dr Ladona Ridgel. If she is mainly just having contd fatigue, rec she get the blood work and we see what that shows. Nlet her iknow not aware that a chiro could release toxins in her body but I will honor her interpretatio  of what they do

## 2019-08-30 NOTE — Telephone Encounter (Signed)
Please advise. Thank you

## 2019-08-30 NOTE — Telephone Encounter (Signed)
Patient seen last Thursday for fatigue.  Says she thinks the chiropractor "released toxins" in her body from the massage.  Still having chills, fatigue, bodyaches and now headache.  Hasn't had bloodwork done yet.  Wants to know if she needs to be seen again?

## 2019-08-31 ENCOUNTER — Encounter: Payer: Self-pay | Admitting: Family Medicine

## 2019-08-31 ENCOUNTER — Other Ambulatory Visit: Payer: Self-pay | Admitting: Family Medicine

## 2019-08-31 DIAGNOSIS — D729 Disorder of white blood cells, unspecified: Secondary | ICD-10-CM

## 2019-08-31 LAB — CBC WITH DIFFERENTIAL/PLATELET
Basophils Absolute: 0 10*3/uL (ref 0.0–0.2)
Basos: 1 %
EOS (ABSOLUTE): 0 10*3/uL (ref 0.0–0.4)
Eos: 0 %
Hematocrit: 38.9 % (ref 34.0–46.6)
Hemoglobin: 13.2 g/dL (ref 11.1–15.9)
Immature Grans (Abs): 0 10*3/uL (ref 0.0–0.1)
Immature Granulocytes: 1 %
Lymphocytes Absolute: 0.6 10*3/uL — ABNORMAL LOW (ref 0.7–3.1)
Lymphs: 28 %
MCH: 31.2 pg (ref 26.6–33.0)
MCHC: 33.9 g/dL (ref 31.5–35.7)
MCV: 92 fL (ref 79–97)
Monocytes Absolute: 0.1 10*3/uL (ref 0.1–0.9)
Monocytes: 6 %
Neutrophils Absolute: 1.3 10*3/uL — ABNORMAL LOW (ref 1.4–7.0)
Neutrophils: 64 %
Platelets: 146 10*3/uL — ABNORMAL LOW (ref 150–450)
RBC: 4.23 x10E6/uL (ref 3.77–5.28)
RDW: 12.7 % (ref 11.7–15.4)
WBC: 2 10*3/uL — CL (ref 3.4–10.8)

## 2019-08-31 LAB — VITAMIN D 25 HYDROXY (VIT D DEFICIENCY, FRACTURES): Vit D, 25-Hydroxy: 34.2 ng/mL (ref 30.0–100.0)

## 2019-08-31 LAB — FERRITIN: Ferritin: 540 ng/mL — ABNORMAL HIGH (ref 15–150)

## 2019-09-14 ENCOUNTER — Other Ambulatory Visit: Payer: Self-pay

## 2019-09-14 ENCOUNTER — Inpatient Hospital Stay (HOSPITAL_COMMUNITY): Payer: PRIVATE HEALTH INSURANCE

## 2019-09-14 ENCOUNTER — Inpatient Hospital Stay (HOSPITAL_COMMUNITY): Payer: PRIVATE HEALTH INSURANCE | Attending: Hematology | Admitting: Hematology

## 2019-09-14 ENCOUNTER — Encounter (HOSPITAL_COMMUNITY): Payer: Self-pay | Admitting: Hematology

## 2019-09-14 VITALS — BP 122/82 | HR 95 | Temp 97.2°F | Resp 18 | Ht 64.0 in | Wt 194.8 lb

## 2019-09-14 DIAGNOSIS — Z888 Allergy status to other drugs, medicaments and biological substances status: Secondary | ICD-10-CM | POA: Insufficient documentation

## 2019-09-14 DIAGNOSIS — R5383 Other fatigue: Secondary | ICD-10-CM | POA: Diagnosis not present

## 2019-09-14 DIAGNOSIS — D72819 Decreased white blood cell count, unspecified: Secondary | ICD-10-CM

## 2019-09-14 DIAGNOSIS — Z801 Family history of malignant neoplasm of trachea, bronchus and lung: Secondary | ICD-10-CM | POA: Insufficient documentation

## 2019-09-14 DIAGNOSIS — Z836 Family history of other diseases of the respiratory system: Secondary | ICD-10-CM | POA: Diagnosis not present

## 2019-09-14 DIAGNOSIS — Z8262 Family history of osteoporosis: Secondary | ICD-10-CM | POA: Insufficient documentation

## 2019-09-14 DIAGNOSIS — R748 Abnormal levels of other serum enzymes: Secondary | ICD-10-CM | POA: Diagnosis not present

## 2019-09-14 DIAGNOSIS — Z803 Family history of malignant neoplasm of breast: Secondary | ICD-10-CM

## 2019-09-14 DIAGNOSIS — Z79899 Other long term (current) drug therapy: Secondary | ICD-10-CM | POA: Diagnosis not present

## 2019-09-14 DIAGNOSIS — Z809 Family history of malignant neoplasm, unspecified: Secondary | ICD-10-CM

## 2019-09-14 DIAGNOSIS — Z8249 Family history of ischemic heart disease and other diseases of the circulatory system: Secondary | ICD-10-CM

## 2019-09-14 LAB — COMPREHENSIVE METABOLIC PANEL
ALT: 30 U/L (ref 0–44)
AST: 24 U/L (ref 15–41)
Albumin: 3.9 g/dL (ref 3.5–5.0)
Alkaline Phosphatase: 129 U/L — ABNORMAL HIGH (ref 38–126)
Anion gap: 6 (ref 5–15)
BUN: 14 mg/dL (ref 8–23)
CO2: 28 mmol/L (ref 22–32)
Calcium: 9.2 mg/dL (ref 8.9–10.3)
Chloride: 99 mmol/L (ref 98–111)
Creatinine, Ser: 0.65 mg/dL (ref 0.44–1.00)
GFR calc Af Amer: >60 mL/min (ref >60)
GFR calc non Af Amer: >60 mL/min (ref >60)
Glucose, Bld: 98 mg/dL (ref 70–99)
Potassium: 3.9 mmol/L (ref 3.5–5.1)
Sodium: 133 mmol/L — ABNORMAL LOW (ref 135–145)
Total Bilirubin: 0.8 mg/dL (ref 0.3–1.2)
Total Protein: 7.5 g/dL (ref 6.5–8.1)

## 2019-09-14 LAB — CBC WITH DIFFERENTIAL/PLATELET
Abs Immature Granulocytes: 0.01 10*3/uL (ref 0.00–0.07)
Basophils Absolute: 0.1 10*3/uL (ref 0.0–0.1)
Basophils Relative: 1 %
Eosinophils Absolute: 0.1 10*3/uL (ref 0.0–0.5)
Eosinophils Relative: 1 %
HCT: 37.7 % (ref 36.0–46.0)
Hemoglobin: 12.1 g/dL (ref 12.0–15.0)
Immature Granulocytes: 0 %
Lymphocytes Relative: 52 %
Lymphs Abs: 3.8 10*3/uL (ref 0.7–4.0)
MCH: 30.8 pg (ref 26.0–34.0)
MCHC: 32.1 g/dL (ref 30.0–36.0)
MCV: 95.9 fL (ref 80.0–100.0)
Monocytes Absolute: 0.5 10*3/uL (ref 0.1–1.0)
Monocytes Relative: 7 %
Neutro Abs: 2.8 10*3/uL (ref 1.7–7.7)
Neutrophils Relative %: 39 %
Platelets: 413 10*3/uL — ABNORMAL HIGH (ref 150–400)
RBC: 3.93 MIL/uL (ref 3.87–5.11)
RDW: 12.8 % (ref 11.5–15.5)
WBC: 7.3 10*3/uL (ref 4.0–10.5)
nRBC: 0 % (ref 0.0–0.2)

## 2019-09-14 LAB — IRON AND TIBC
Iron: 118 ug/dL (ref 28–170)
Saturation Ratios: 31 % (ref 10.4–31.8)
TIBC: 378 ug/dL (ref 250–450)
UIBC: 260 ug/dL

## 2019-09-14 LAB — FOLATE: Folate: 23.1 ng/mL (ref >5.9)

## 2019-09-14 LAB — SEDIMENTATION RATE: Sed Rate: 28 mm/hr — ABNORMAL HIGH (ref 0–22)

## 2019-09-14 LAB — RETICULOCYTES
Immature Retic Fract: 7.7 % (ref 2.3–15.9)
RBC.: 3.91 MIL/uL (ref 3.87–5.11)
Retic Count, Absolute: 52.4 10*3/uL (ref 19.0–186.0)
Retic Ct Pct: 1.3 % (ref 0.4–3.1)

## 2019-09-14 LAB — LACTATE DEHYDROGENASE: LDH: 155 U/L (ref 98–192)

## 2019-09-14 LAB — HEPATITIS B SURFACE ANTIBODY,QUALITATIVE: Hep B S Ab: NONREACTIVE

## 2019-09-14 LAB — C-REACTIVE PROTEIN: CRP: 0.6 mg/dL (ref <1.0)

## 2019-09-14 LAB — HEPATITIS B SURFACE ANTIGEN: Hepatitis B Surface Ag: NONREACTIVE

## 2019-09-14 LAB — HEPATITIS B CORE ANTIBODY, IGM: Hep B C IgM: NONREACTIVE

## 2019-09-14 LAB — VITAMIN B12: Vitamin B-12: 412 pg/mL (ref 180–914)

## 2019-09-14 LAB — FERRITIN: Ferritin: 51 ng/mL (ref 11–307)

## 2019-09-14 LAB — VITAMIN D 25 HYDROXY (VIT D DEFICIENCY, FRACTURES): Vit D, 25-Hydroxy: 45.26 ng/mL (ref 30–100)

## 2019-09-14 NOTE — Patient Instructions (Signed)
[facility] at [facility] Discharge Instructions  Follow up in 2-3 weeks   Thank you for choosing [facility] at [facility] to provide your oncology and hematology care.  To afford each patient quality time with our provider, please arrive at least 15 minutes before your scheduled appointment time.   If you have a lab appointment with the Cancer Center please come in thru the Main Entrance and check in at the main information desk.  You need to re-schedule your appointment should you arrive 10 or more minutes late.  We strive to give you quality time with our providers, and arriving late affects you and other patients whose appointments are after yours.  Also, if you no show three or more times for appointments you may be dismissed from the clinic at the providers discretion.     Again, thank you for choosing [facility].  Our hope is that these requests will decrease the amount of time that you wait before being seen by our physicians.       _____________________________________________________________  Should you have questions after your visit to [facility], please contact our office at [phone] between the hours of 8:00 a.m. and 4:30 p.m.  Voicemails left after 4:00 p.m. will not be returned until the following business day.  For prescription refill requests, have your pharmacy contact our office and allow 72 hours.    Due to Covid, you will need to wear a mask upon entering the hospital. If you do not have a mask, a mask will be given to you at the Main Entrance upon arrival. For doctor visits, patients may have 1 support person with them. For treatment visits, patients can not have anyone with them due to social distancing guidelines and our immunocompromised population.      

## 2019-09-14 NOTE — Progress Notes (Signed)
CONSULT NOTE  Patient Care Team: Mikey Kirschner, MD as PCP - General (Family Medicine) Gala Romney Cristopher Estimable, MD as Consulting Physician (Gastroenterology)  CHIEF COMPLAINTS/PURPOSE OF CONSULTATION: Leukopenia  HISTORY OF PRESENTING ILLNESS:  Julie Campos 64 y.o. female was sent here by her PCP Dr. Wolfgang Phoenix for leukopenia.  Her last documented WBC on 08/30/2019 showed 2.0.  She has only had 1 other reading at the end of 2020 where her WBC was low.  Patient reports she has had no issues previously in her life.  She does report she has been more fatigued lately.  She has to force herself to do ADLs and activities for fun.  She was told her iron levels were elevated at her last visit.  Her and her husband used to cook everything out of cast iron pans.  They have recently stopped doing this and started using their pressure cooker.  She reports she does feel little better since she has stopped using the cast iron pans.  Patient denies any new medications.  She denies any antibiotics for any reasons.  She denies a history of hepatitis.  She denies any B symptoms including night sweats, fevers, chills or and or unexplained weight loss.  She reports she has lost 20 pounds over the past few months due to a gluten-free diet her and her husband have been trying.  She denies any recent chest pain on exertion, shortness of breath on exertion, presyncopal episodes or palpitations.  She does report a positive family history for a paternal grandfather with lung cancer.  Paternal uncle with lung cancer.  Paternal aunt with breast cancer.  And another paternal aunt with lung cancer.  She lives at home with her husband and performs all her own ADLs.   MEDICAL HISTORY:  Past Medical History:  Diagnosis Date  . Allergy   . Arthritis   . Chronic back pain   . Depression   . Insomnia   . Reflux     SURGICAL HISTORY: History reviewed. No pertinent surgical history.  SOCIAL HISTORY: Social History    Socioeconomic History  . Marital status: Married    Spouse name: Not on file  . Number of children: 1  . Years of education: Not on file  . Highest education level: Not on file  Occupational History  . Not on file  Tobacco Use  . Smoking status: Never Smoker  . Smokeless tobacco: Never Used  Substance and Sexual Activity  . Alcohol use: No    Alcohol/week: 0.0 standard drinks  . Drug use: No  . Sexual activity: Not Currently    Birth control/protection: Post-menopausal  Other Topics Concern  . Not on file  Social History Narrative  . Not on file   Social Determinants of Health   Financial Resource Strain:   . Difficulty of Paying Living Expenses:   Food Insecurity:   . Worried About Charity fundraiser in the Last Year:   . Arboriculturist in the Last Year:   Transportation Needs:   . Film/video editor (Medical):   Marland Kitchen Lack of Transportation (Non-Medical):   Physical Activity:   . Days of Exercise per Week:   . Minutes of Exercise per Session:   Stress:   . Feeling of Stress :   Social Connections:   . Frequency of Communication with Friends and Family:   . Frequency of Social Gatherings with Friends and Family:   . Attends Religious Services:   . Active  Member of Clubs or Organizations:   . Attends Archivist Meetings:   Marland Kitchen Marital Status:   Intimate Partner Violence:   . Fear of Current or Ex-Partner:   . Emotionally Abused:   Marland Kitchen Physically Abused:   . Sexually Abused:     FAMILY HISTORY: Family History  Problem Relation Age of Onset  . Osteoporosis Mother   . Heart disease Father   . Pulmonary fibrosis Father   . Lung cancer Paternal Grandfather        smoked  . Lung cancer Paternal Uncle        smoked  . Cancer Paternal Aunt   . Breast cancer Paternal Aunt   . Lung disease Paternal Grandmother     ALLERGIES:  has No Known Allergies.  MEDICATIONS:  Current Outpatient Medications  Medication Sig Dispense Refill  . busPIRone  (BUSPAR) 10 MG tablet Take 1 tablet (10 mg total) by mouth 2 (two) times daily. 60 tablet 5  . clobetasol (TEMOVATE) 0.05 % external solution     . clonazePAM (KLONOPIN) 0.5 MG tablet Take one tablet po qhs for insomnia;take 1/2 tablet po prn anxiety 36 tablet 5  . famotidine (PEPCID) 20 MG tablet TAKE 1 TABLET(20 MG) BY MOUTH AT BEDTIME 90 tablet 1  . pantoprazole (PROTONIX) 40 MG tablet TAKE 1 TABLET(40 MG) BY MOUTH DAILY AS NEEDED FOR ACID REFLUX 90 tablet 1  . sertraline (ZOLOFT) 100 MG tablet TAKE 1 TABLET(100 MG) BY MOUTH DAILY 30 tablet 5  . lidocaine (LIDODERM) 5 % APPLY ONE PATCH TO THE SKIN AS DIRECTED**WEAR PATCH ON FOR 12 HOURS AND OFF FOR 12 HOURS** (Patient not taking: Reported on 09/14/2019) 30 patch 0  . meloxicam (MOBIC) 15 MG tablet Take 1 tablet (15 mg total) by mouth daily. (Patient not taking: Reported on 09/14/2019) 30 tablet 0  . VOLTAREN 1 % GEL      No current facility-administered medications for this visit.    REVIEW OF SYSTEMS:   Constitutional: Denies fevers, chills or abnormal night sweats, + headaches and sleep problems Respiratory: Denies cough, dyspnea or wheezes Cardiovascular: Denies palpitation, chest discomfort or lower extremity swelling Gastrointestinal:  Denies nausea, heartburn or change in bowel habits Skin: Denies abnormal skin rashes Lymphatics: Denies new lymphadenopathy or easy bruising Neurological:Denies numbness, tingling or new weaknesses Behavioral/Psych: Mood is stable, no new changes , + anxiety All other systems were reviewed with the patient and are negative.  PHYSICAL EXAMINATION: ECOG PERFORMANCE STATUS: 0 - Asymptomatic  Vitals:   09/14/19 1346  BP: 122/82  Pulse: 95  Resp: 18  Temp: (!) 97.2 F (36.2 C)  SpO2: 98%   Filed Weights   09/14/19 1346  Weight: 194 lb 12.8 oz (88.4 kg)    GENERAL:alert, no distress and comfortable SKIN: skin color, texture, turgor are normal, no rashes or significant lesions NECK: supple,  thyroid normal size, non-tender, without nodularity LYMPH:  no palpable lymphadenopathy in the cervical, axillary or inguinal LUNGS: clear to auscultation and percussion with normal breathing effort HEART: regular rate & rhythm and no murmurs and no lower extremity edema ABDOMEN:abdomen soft, non-tender and normal bowel sounds Musculoskeletal:no cyanosis of digits and no clubbing  PSYCH: alert & oriented x 3 with fluent speech NEURO: no focal motor/sensory deficits  LABORATORY DATA:  I have reviewed the data as listed Recent Results (from the past 2160 hour(s))  Vitamin D (25 hydroxy)     Status: None   Collection Time: 08/30/19 10:23 AM  Result Value  Ref Range   Vit D, 25-Hydroxy 34.2 30.0 - 100.0 ng/mL    Comment: Vitamin D deficiency has been defined by the [place] practice guideline as a level of serum 25-OH vitamin D less than 20 ng/mL (1,2). The Endocrine Society went on to further define vitamin D insufficiency as a level between 21 and 29 ng/mL (2). 1. IOM (Institute of Medicine). 2010. Dietary reference    intakes for calcium and D. [place]: The    [employer]. 2. Holick MF, Binkley Kingman, Bischoff-Ferrari HA, et al.    Evaluation, treatment, and prevention of vitamin D    deficiency: an Endocrine Society clinical practice    guideline. JCEM. 2011 Jul; 96(7):1911-30.   CBC with Differential     Status: Abnormal   Collection Time: 08/30/19 10:23 AM  Result Value Ref Range   WBC 2.0 (LL) 3.4 - 10.8 x10E3/uL   RBC 4.23 3.77 - 5.28 x10E6/uL   Hemoglobin 13.2 11.1 - 15.9 g/dL   Hematocrit 38.9 34.0 - 46.6 %   MCV 92 79 - 97 fL   MCH 31.2 26.6 - 33.0 pg   MCHC 33.9 31.5 - 35.7 g/dL   RDW 12.7 11.7 - 15.4 %   Platelets 146 (L) 150 - 450 x10E3/uL   Neutrophils 64 Not Estab. %   Lymphs 28 Not Estab. %   Monocytes 6 Not Estab. %   Eos 0 Not Estab. %   Basos 1 Not Estab. %   Neutrophils Absolute 1.3 (L) 1.4 - 7.0  x10E3/uL   Lymphocytes Absolute 0.6 (L) 0.7 - 3.1 x10E3/uL   Monocytes Absolute 0.1 0.1 - 0.9 x10E3/uL   EOS (ABSOLUTE) 0.0 0.0 - 0.4 x10E3/uL   Basophils Absolute 0.0 0.0 - 0.2 x10E3/uL   Immature Granulocytes 1 Not Estab. %   Immature Grans (Abs) 0.0 0.0 - 0.1 x10E3/uL   Hematology Comments: Note:     Comment: Verified by microscopic examination.  Ferritin     Status: Abnormal   Collection Time: 08/30/19 10:23 AM  Result Value Ref Range   Ferritin 540 (H) 15 - 150 ng/mL  Sedimentation rate     Status: Abnormal   Collection Time: 09/14/19  2:34 PM  Result Value Ref Range   Sed Rate 28 (H) 0 - 22 mm/hr    Comment: Performed at [facility], [address], [place], [place] [address]  C-reactive protein     Status: None   Collection Time: 09/14/19  2:34 PM  Result Value Ref Range   CRP 0.6 <1.0 mg/dL    Comment: Performed at [facility], [address], [place], [place] [address]  Folate     Status: None   Collection Time: 09/14/19  2:34 PM  Result Value Ref Range   Folate 23.1 >5.9 ng/mL    Comment: Performed at [facility], [address], [place], [place] [address]  Vitamin B12     Status: None   Collection Time: 09/14/19  2:34 PM  Result Value Ref Range   Vitamin B-12 412 180 - 914 pg/mL    Comment: (NOTE) This assay is not validated for testing neonatal or myeloproliferative syndrome specimens for Vitamin B12 levels. Performed at [facility], [address], [place], [place] [address]   Reticulocytes     Status: None   Collection Time: 09/14/19  2:34 PM  Result Value Ref Range   Retic Ct Pct 1.3 0.4 - 3.1 %   RBC. 3.91 3.87 -  5.11 MIL/uL   Retic Count, Absolute 52.4 19.0 - 186.0 K/uL   Immature Retic Fract 7.7 2.3 - 15.9 %    Comment: Performed at [facility], [address], [place], [place] [address]  Iron and TIBC     Status: None   Collection Time: 09/14/19  2:34 PM  Result Value Ref Range   Iron 118 28 - 170 ug/dL   TIBC 378 250 - 450 ug/dL   Saturation  Ratios 31 10.4 - 31.8 %   UIBC 260 ug/dL    Comment: Performed at [facility], [address], [place], [place] [address]  Ferritin     Status: None   Collection Time: 09/14/19  2:34 PM  Result Value Ref Range   Ferritin 51 11 - 307 ng/mL    Comment: Performed at [facility], [address], [place], [place] [address]  Comprehensive metabolic panel     Status: Abnormal   Collection Time: 09/14/19  2:34 PM  Result Value Ref Range   Sodium 133 (L) 135 - 145 mmol/L   Potassium 3.9 3.5 - 5.1 mmol/L   Chloride 99 98 - 111 mmol/L   CO2 28 22 - 32 mmol/L   Glucose, Bld 98 70 - 99 mg/dL    Comment: Glucose reference range applies only to samples taken after fasting for at least 8 hours.   BUN 14 8 - 23 mg/dL   Creatinine, Ser 0.65 0.44 - 1.00 mg/dL   Calcium 9.2 8.9 - 10.3 mg/dL   Total Protein 7.5 6.5 - 8.1 g/dL   Albumin 3.9 3.5 - 5.0 g/dL   AST 24 15 - 41 U/L   ALT 30 0 - 44 U/L   Alkaline Phosphatase 129 (H) 38 - 126 U/L   Total Bilirubin 0.8 0.3 - 1.2 mg/dL   GFR calc non Af Amer >60 >60 mL/min   GFR calc Af Amer >60 >60 mL/min   Anion gap 6 5 - 15    Comment: Performed at [facility], [address], [place],  [address]  CBC with Differential/Platelet     Status: Abnormal   Collection Time: 09/14/19  2:34 PM  Result Value Ref Range   WBC 7.3 4.0 - 10.5 K/uL   RBC 3.93 3.87 - 5.11 MIL/uL   Hemoglobin 12.1 12.0 - 15.0 g/dL   HCT 37.7 36.0 - 46.0 %   MCV 95.9 80.0 - 100.0 fL   MCH 30.8 26.0 - 34.0 pg   MCHC 32.1 30.0 - 36.0 g/dL   RDW 12.8 11.5 - 15.5 %   Platelets 413 (H) 150 - 400 K/uL   nRBC 0.0 0.0 - 0.2 %   Neutrophils Relative % 39 %   Neutro Abs 2.8 1.7 - 7.7 K/uL   Lymphocytes Relative 52 %   Lymphs Abs 3.8 0.7 - 4.0 K/uL   Monocytes Relative 7 %   Monocytes Absolute 0.5 0.1 - 1.0 K/uL   Eosinophils Relative 1 %   Eosinophils Absolute 0.1 0.0 - 0.5 K/uL   Basophils Relative 1 %   Basophils Absolute 0.1 0.0 - 0.1 K/uL   Immature Granulocytes 0 %   Abs  Immature Granulocytes 0.01 0.00 - 0.07 K/uL    Comment: Performed at [facility], [address] [place], [place] [address]  Lactate dehydrogenase     Status: None   Collection Time: 09/14/19  2:34 PM  Result Value Ref Range   LDH 155 98 - 192 U/L    Comment: Performed  at [facility], [address], [place], [place] [address]    RADIOGRAPHIC STUDIES: I have personally reviewed the radiological images as listed and agreed with the findings in the report.  ASSESSMENT & PLAN:  1.  Leukopenia: -CBC on 08/30/2019 shows white count 2.0 with absolute neutrophil count of 1.3. -Prior to that CBC on 01/19/2019 shows white count 3.6 with ANC of 1.6. -She denies any recurrent infections.  No recent new medications. -She was on buspirone and Zoloft for several years. -Ultrasound of the abdomen on 04/22/2014 shows unremarkable liver. -CT of the chest on 09/22/2014 shows numerous calcified granulomas in the spleen. -Denies any connective tissue disorders.  Denies any prior history of blood transfusions. -We will repeat her CBC and review her smear.  We will check for nutritional deficiencies including S91, folic acid, methylmalonic acid and copper levels.  We will also check SPEP, ANA, rheumatoid factor, ESR and CRP.  We will check for hepatitis panel. -We will see her back in 2 weeks to discuss results.  If there is no cause to explain the leukopenia, she will need a bone marrow biopsy.    All questions were answered. The patient knows to call the clinic with any problems, questions or concerns.      Derek Jack, MD 09/14/19 5:51 PM

## 2019-09-15 ENCOUNTER — Other Ambulatory Visit: Payer: Self-pay | Admitting: Family Medicine

## 2019-09-15 LAB — PROTEIN ELECTROPHORESIS, SERUM
A/G Ratio: 1 (ref 0.7–1.7)
Albumin ELP: 3.5 g/dL (ref 2.9–4.4)
Alpha-1-Globulin: 0.2 g/dL (ref 0.0–0.4)
Alpha-2-Globulin: 0.7 g/dL (ref 0.4–1.0)
Beta Globulin: 1.3 g/dL (ref 0.7–1.3)
Gamma Globulin: 1.2 g/dL (ref 0.4–1.8)
Globulin, Total: 3.4 g/dL (ref 2.2–3.9)
Total Protein ELP: 6.9 g/dL (ref 6.0–8.5)

## 2019-09-15 LAB — ANTINUCLEAR ANTIBODIES, IFA: ANA Ab, IFA: NEGATIVE

## 2019-09-15 LAB — RHEUMATOID FACTOR: Rheumatoid fact SerPl-aCnc: <10 IU/mL (ref 0.0–13.9)

## 2019-09-16 LAB — COPPER, SERUM: Copper: 138 ug/dL (ref 80–158)

## 2019-09-20 LAB — METHYLMALONIC ACID, SERUM: Methylmalonic Acid, Quantitative: 222 nmol/L (ref 0–378)

## 2019-09-29 ENCOUNTER — Inpatient Hospital Stay (HOSPITAL_BASED_OUTPATIENT_CLINIC_OR_DEPARTMENT_OTHER): Payer: PRIVATE HEALTH INSURANCE | Admitting: Hematology

## 2019-09-29 ENCOUNTER — Other Ambulatory Visit: Payer: Self-pay | Admitting: Family Medicine

## 2019-09-29 ENCOUNTER — Other Ambulatory Visit: Payer: Self-pay

## 2019-09-29 VITALS — BP 124/75 | HR 103 | Temp 98.1°F | Resp 18 | Wt 196.0 lb

## 2019-09-29 DIAGNOSIS — D72819 Decreased white blood cell count, unspecified: Secondary | ICD-10-CM | POA: Diagnosis not present

## 2019-09-29 NOTE — Patient Instructions (Signed)
[facility] at [facility] Discharge Instructions  You were seen today by Dr. Ellin Saba. He went over your recent results and possible diagnoses. Continue your routine care with your primary care provider. Dr. Ellin Saba will see you back in 3 months for labs and follow up.   Thank you for choosing  Cancer Center at [facility] to provide your oncology and hematology care.  To afford each patient quality time with our provider, please arrive at least 15 minutes before your scheduled appointment time.   If you have a lab appointment with the Cancer Center please come in thru the Main Entrance and check in at the main information desk  You need to re-schedule your appointment should you arrive 10 or more minutes late.  We strive to give you quality time with our providers, and arriving late affects you and other patients whose appointments are after yours.  Also, if you no show three or more times for appointments you may be dismissed from the clinic at the providers discretion.     Again, thank you for choosing [facility].  Our hope is that these requests will decrease the amount of time that you wait before being seen by our physicians.       _____________________________________________________________  Should you have questions after your visit to [facility], please contact our office at [phone] between the hours of 8:00 a.m. and 4:30 p.m.  Voicemails left after 4:00 p.m. will not be returned until the following business day.  For prescription refill requests, have your pharmacy contact our office and allow 72 hours.    Cancer Center Support Programs:   > Cancer Support Group  2nd Tuesday of the month 1pm-2pm, Journey Room

## 2019-09-29 NOTE — Progress Notes (Signed)
Julie Campos, [place] [address]   CLINIC:  Medical Oncology/Hematology  PCP:  Mikey Kirschner, [place] [address] [place] [address]  [phone]  REASON FOR VISIT:  Follow-up for leukopenia.  CURRENT THERAPY: Observation  INTERVAL HISTORY:  Julie Campos, a 64 y.o. female, returns for routine follow-up for her leukopenia. Julie Campos was last seen on 09/14/2019.  Denies any fevers, night sweats or weight loss.  Appetite and energy levels are 75%.  Today she is accompanied by her husband and she reports that she started taking B complex vitamins which has helped with her fatigue.   REVIEW OF SYSTEMS:  Review of Systems  Constitutional: Positive for appetite change (mildly decreased) and fatigue (mild).  All other systems reviewed and are negative.   PAST MEDICAL/SURGICAL HISTORY:  Past Medical History:  Diagnosis Date  . Allergy   . Arthritis   . Chronic back pain   . Depression   . Insomnia   . Reflux    No past surgical history on file.  SOCIAL HISTORY:  Social History   Socioeconomic History  . Marital status: Married    Spouse name: Not on file  . Number of children: 1  . Years of education: Not on file  . Highest education level: Not on file  Occupational History  . Not on file  Tobacco Use  . Smoking status: Never Smoker  . Smokeless tobacco: Never Used  Substance and Sexual Activity  . Alcohol use: No    Alcohol/week: 0.0 standard drinks  . Drug use: No  . Sexual activity: Not Currently    Birth control/protection: Post-menopausal  Other Topics Concern  . Not on file  Social History Narrative  . Not on file   Social Determinants of Health   Financial Resource Strain:   . Difficulty of Paying Living Expenses:   Food Insecurity:   . Worried About Charity fundraiser in the Last Year:   . Arboriculturist in the Last Year:   Transportation Needs:   . Film/video editor (Medical):     Marland Kitchen Lack of Transportation (Non-Medical):   Physical Activity:   . Days of Exercise per Week:   . Minutes of Exercise per Session:   Stress:   . Feeling of Stress :   Social Connections:   . Frequency of Communication with Friends and Family:   . Frequency of Social Gatherings with Friends and Family:   . Attends Religious Services:   . Active Member of Clubs or Organizations:   . Attends Archivist Meetings:   Marland Kitchen Marital Status:   Intimate Partner Violence:   . Fear of Current or Ex-Partner:   . Emotionally Abused:   Marland Kitchen Physically Abused:   . Sexually Abused:     FAMILY HISTORY:  Family History  Problem Relation Age of Onset  . Osteoporosis Mother   . Heart disease Father   . Pulmonary fibrosis Father   . Lung cancer Paternal Grandfather        smoked  . Lung cancer Paternal Uncle        smoked  . Cancer Paternal Aunt   . Breast cancer Paternal Aunt   . Lung disease Paternal Grandmother     CURRENT MEDICATIONS:  Current Outpatient Medications  Medication Sig Dispense Refill  . busPIRone (BUSPAR) 10 MG tablet Take 1 tablet (10 mg total) by mouth 2 (two) times daily. 60 tablet  5  . clobetasol (TEMOVATE) 0.05 % external solution     . clonazePAM (KLONOPIN) 0.5 MG tablet TAKE 1 TABLET BY MOUTH EVERY NIGHT AT BEDTIME AS NEEDED FOR ANXIETY 30 tablet 0  . famotidine (PEPCID) 20 MG tablet TAKE 1 TABLET(20 MG) BY MOUTH AT BEDTIME 90 tablet 1  . lidocaine (LIDODERM) 5 % APPLY ONE PATCH TO THE SKIN AS DIRECTED**WEAR PATCH ON FOR 12 HOURS AND OFF FOR 12 HOURS** (Patient not taking: Reported on 09/14/2019) 30 patch 0  . meloxicam (MOBIC) 15 MG tablet Take 1 tablet (15 mg total) by mouth daily. (Patient not taking: Reported on 09/14/2019) 30 tablet 0  . pantoprazole (PROTONIX) 40 MG tablet TAKE 1 TABLET(40 MG) BY MOUTH DAILY AS NEEDED FOR ACID REFLUX 90 tablet 1  . sertraline (ZOLOFT) 100 MG tablet TAKE 1 TABLET(100 MG) BY MOUTH DAILY 30 tablet 5  . VOLTAREN 1 % GEL      No  current facility-administered medications for this visit.    ALLERGIES:  No Known Allergies  PHYSICAL EXAM:  Performance status (ECOG): 0 - Asymptomatic  There were no vitals filed for this visit. Wt Readings from Last 3 Encounters:  09/14/19 194 lb 12.8 oz (88.4 kg)  04/02/18 213 lb 3.2 oz (96.7 kg)  07/30/17 213 lb 9.6 oz (96.9 kg)   Physical Exam Vitals reviewed.  Constitutional:      Appearance: Normal appearance.  Cardiovascular:     Rate and Rhythm: Normal rate and regular rhythm.     Heart sounds: Normal heart sounds.  Pulmonary:     Effort: Pulmonary effort is normal.     Breath sounds: Normal breath sounds.  Neurological:     Mental Status: She is alert and oriented to person, place, and time.  Psychiatric:        Mood and Affect: Mood normal.        Behavior: Behavior normal.     LABORATORY DATA:  I have reviewed the labs as listed.  CBC Latest Ref Rng & Units 09/14/2019 08/30/2019 01/19/2019  WBC 4.0 - 10.5 K/uL 7.3 2.0(LL) 3.6  Hemoglobin 12.0 - 15.0 g/dL 57.2 62.0 35.5  Hematocrit 36 - 46 % 37.7 38.9 38.2  Platelets 150 - 400 K/uL 413(H) 146(L) 317   CMP Latest Ref Rng & Units 09/14/2019 01/19/2019 09/30/2016  Glucose 70 - 99 mg/dL 98 96 93  BUN 8 - 23 mg/dL 14 10 8   Creatinine 0.44 - 1.00 mg/dL 9.74 1.63  Sodium 135 - 145 mmol/L 133(L) 139 142  Potassium 3.5 - 5.1 mmol/L 3.9 4.5 4.5  Chloride 98 - 111 mmol/L 99 101 102  CO2 22 - 32 mmol/L 28 25 25   Calcium 8.9 - 10.3 mg/dL 9.2 9.2 9.5  Total Protein 6.5 - 8.1 g/dL 7.5 6.9 6.9  Total Bilirubin 0.3 - 1.2 mg/dL 0.8 0.6 0.4  Alkaline Phos 38 - 126 U/L 129(H) 107 108  AST 15 - 41 U/L 24 15 18   ALT 0 - 44 U/L 30 13 13       Component Value Date/Time   RBC 3.91 09/14/2019 1434   RBC 3.93 09/14/2019 1434   MCV 95.9 09/14/2019 1434   MCV 92 08/30/2019 1023   MCH 30.8 09/14/2019 1434   MCHC 32.1 09/14/2019 1434   RDW 12.8 09/14/2019 1434   RDW 12.7 08/30/2019 1023   LYMPHSABS 3.8 09/14/2019 1434    LYMPHSABS 0.6 (L) 08/30/2019 1023   MONOABS 0.5 09/14/2019 1434   EOSABS 0.1 09/14/2019 1434  EOSABS 0.0 08/30/2019 1023   BASOSABS 0.1 09/14/2019 1434   BASOSABS 0.0 08/30/2019 1023   Lab Results  Component Value Date   LDH 155 09/14/2019   Lab Results  Component Value Date   TIBC 378 09/14/2019   FERRITIN 51 09/14/2019   FERRITIN 540 (H) 08/30/2019   IRONPCTSAT 31 09/14/2019    DIAGNOSTIC IMAGING:  I have independently reviewed the scans and discussed with the patient.    ASSESSMENT:  1.  Leukopenia: -Patient seen at the request of Dr. Gerda Diss for leukopenia. -CBC on 08/30/2019 shows white count 2.0 with ANC of 1.3. -CBC on 01/19/2019 with white count 3.6 with ANC of 1.6. -No recurrent infections or B symptoms.  She was on buspirone and Zoloft for several years. -Ultrasound abdomen on 04/22/2014 shows unremarkable liver. -CT of the chest on 09/22/2014 shows numerous calcified granulomas in the spleen.  PLAN:  1.  Leukopenia: -I reviewed blood work from 09/14/2019 with the patient and her husband. -CBC showed white count 7.3 with normal hemoglobin.  Platelet count improved to 413.  SPEP was negative.  B12, methylmalonic acid, copper, folic acid and ferritin levels were normal. -Her leukopenia has resolved.  No further work-up needed at this time.  I plan to repeat her CBC in 3 months.  If it continues to be normal, we will discharge her from the clinic.  2.  Elevated alkaline phosphatase: -Her alkaline phosphatase was elevated at 129 on 09/14/2019.  Prior to that it was normal on 01/19/2019. -I plan to repeat it at next visit.   Orders placed this encounter:  No orders of the defined types were placed in this encounter.    Doreatha Massed, MD [facility] [phone]   I, Drue Second, am acting as a scribe for Dr. Payton Mccallum.  I, Doreatha Massed MD, have reviewed the above documentation for accuracy and completeness, and I agree with the  above.

## 2019-10-18 ENCOUNTER — Other Ambulatory Visit: Payer: Self-pay | Admitting: Family Medicine

## 2019-10-19 NOTE — Telephone Encounter (Signed)
Seen 08/26/19 for anxiety

## 2019-10-29 ENCOUNTER — Ambulatory Visit: Payer: PRIVATE HEALTH INSURANCE | Admitting: Nurse Practitioner

## 2019-11-04 ENCOUNTER — Other Ambulatory Visit: Payer: Self-pay

## 2019-11-04 ENCOUNTER — Telehealth: Payer: Self-pay | Admitting: Family Medicine

## 2019-11-04 ENCOUNTER — Encounter: Payer: Self-pay | Admitting: Nurse Practitioner

## 2019-11-04 ENCOUNTER — Ambulatory Visit (INDEPENDENT_AMBULATORY_CARE_PROVIDER_SITE_OTHER): Payer: PRIVATE HEALTH INSURANCE | Admitting: Nurse Practitioner

## 2019-11-04 VITALS — BP 122/76 | Temp 97.7°F | Wt 193.4 lb

## 2019-11-04 DIAGNOSIS — R5383 Other fatigue: Secondary | ICD-10-CM

## 2019-11-04 DIAGNOSIS — Z124 Encounter for screening for malignant neoplasm of cervix: Secondary | ICD-10-CM | POA: Diagnosis not present

## 2019-11-04 DIAGNOSIS — Z1231 Encounter for screening mammogram for malignant neoplasm of breast: Secondary | ICD-10-CM

## 2019-11-04 DIAGNOSIS — Z1151 Encounter for screening for human papillomavirus (HPV): Secondary | ICD-10-CM

## 2019-11-04 DIAGNOSIS — Z0000 Encounter for general adult medical examination without abnormal findings: Secondary | ICD-10-CM

## 2019-11-04 DIAGNOSIS — Z13220 Encounter for screening for lipoid disorders: Secondary | ICD-10-CM

## 2019-11-04 DIAGNOSIS — Z13820 Encounter for screening for osteoporosis: Secondary | ICD-10-CM
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
## 2019-11-04 NOTE — Telephone Encounter (Signed)
Pt was seen today for Physical and forgot ask for sleep meds she wants to try something different

## 2019-11-04 NOTE — Telephone Encounter (Signed)
Message was sent by chat by the nurse and response sent. Thanks.

## 2019-11-05 ENCOUNTER — Ambulatory Visit: Payer: PRIVATE HEALTH INSURANCE | Admitting: Nurse Practitioner

## 2019-11-05 ENCOUNTER — Telehealth: Payer: Self-pay | Admitting: *Deleted

## 2019-11-05 NOTE — Telephone Encounter (Signed)
I recommend not increasing the Klonopin at this point. 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 If she wants to go to bed earlier, start by going to bed about 30 minutes before her usual time. For example, if she goes to sleep around 2 am, try 1:30, then slowly move the time up. Also avoid phone and tablets before bed. As long as she is getting 6-8 hours of sleep most nights that is good. Thanks. Response from chat given to pt.   Patient is asking your thoughts on remeron for sleep. She states she is exhausted and tired and wasn't able to relay this information to [place] yesterday. She says she will try what was recommended. She does not like the trazodone and advil pm only helps sometimes. She also feels the type of sleep she is getting is not a deep sleep.

## 2019-11-06 LAB — IGP, APTIMA HPV: HPV Aptima: NEGATIVE

## 2019-11-08 ENCOUNTER — Encounter: Payer: Self-pay | Admitting: Nurse Practitioner

## 2019-11-08 NOTE — Progress Notes (Signed)
   Subjective:    Patient ID: Julie Campos, female    DOB: [birth year], 64 y.o.   MRN: [id number]  HPI  See other note.   Review of Systems     Objective:   Physical Exam        Assessment & Plan:

## 2019-11-11 ENCOUNTER — Ambulatory Visit: Payer: PRIVATE HEALTH INSURANCE | Admitting: Nurse Practitioner

## 2019-11-13 ENCOUNTER — Other Ambulatory Visit: Payer: Self-pay | Admitting: Family Medicine

## 2019-11-22 ENCOUNTER — Ambulatory Visit (HOSPITAL_COMMUNITY): Payer: PRIVATE HEALTH INSURANCE

## 2019-11-22 ENCOUNTER — Other Ambulatory Visit (HOSPITAL_COMMUNITY): Payer: PRIVATE HEALTH INSURANCE

## 2019-11-26 ENCOUNTER — Ambulatory Visit: Payer: PRIVATE HEALTH INSURANCE | Admitting: Family Medicine

## 2019-12-06 ENCOUNTER — Other Ambulatory Visit (HOSPITAL_COMMUNITY): Payer: PRIVATE HEALTH INSURANCE

## 2019-12-06 ENCOUNTER — Ambulatory Visit (HOSPITAL_COMMUNITY): Payer: PRIVATE HEALTH INSURANCE

## 2019-12-29 ENCOUNTER — Inpatient Hospital Stay (HOSPITAL_COMMUNITY): Payer: PRIVATE HEALTH INSURANCE

## 2020-01-05 ENCOUNTER — Ambulatory Visit (HOSPITAL_COMMUNITY): Payer: PRIVATE HEALTH INSURANCE | Admitting: Hematology

## 2020-01-31 ENCOUNTER — Other Ambulatory Visit: Payer: Self-pay | Admitting: *Deleted

## 2020-02-18 ENCOUNTER — Other Ambulatory Visit: Payer: Self-pay | Admitting: Nurse Practitioner

## 2020-02-18 ENCOUNTER — Encounter: Payer: Self-pay | Admitting: Nurse Practitioner

## 2020-02-18 ENCOUNTER — Other Ambulatory Visit: Payer: Self-pay

## 2020-02-18 ENCOUNTER — Telehealth: Payer: Self-pay

## 2020-02-18 ENCOUNTER — Ambulatory Visit (INDEPENDENT_AMBULATORY_CARE_PROVIDER_SITE_OTHER): Payer: PRIVATE HEALTH INSURANCE | Admitting: Nurse Practitioner

## 2020-02-18 VITALS — BP 116/74 | Temp 98.0°F | Wt 193.4 lb

## 2020-02-18 DIAGNOSIS — F411 Generalized anxiety disorder: Secondary | ICD-10-CM

## 2020-02-18 MED ORDER — PENICILLIN V POTASSIUM 500 MG PO TABS: 500.0000 mg | ORAL_TABLET | Freq: Three times a day (TID) | ORAL | 0 refills | Status: DC

## 2020-02-18 NOTE — Telephone Encounter (Signed)
Shakia seen Julie Campos today and forgot to ask her if she will call a antibiotic in for her she has a infection with her tooth and jaw, She is going to the dentist next week but was unable to get appt to a dentist today. Pt uses [facility] [address] in [place]   Pt call back [phone]

## 2020-02-18 NOTE — Progress Notes (Signed)
° °  Subjective:    Patient ID: Julie Campos, female    DOB: 1955/06/04, 64 y.o.   MRN: [id number]  HPI Patient comes in to discuss anxiety and depression. She does not feel like medication is working as well as it once did. Has long term issues with her mother. Has done counseling in the past which has worked well. Has noticed increased issues with season change and well as other stressors.     Review of Systems Pt denies HA, N, V, diarrhea, constipation. Denies chest pain, palpitations, cough, SOB. Denies thoughts of hurting self or others.    Reports increased outburst and anxiety  GAD 7 : Generalized Anxiety Score 02/18/2020 02/18/2020 11/04/2019 04/02/2018  Nervous, Anxious, on Edge 2 2 2 2   Control/stop worrying 1 1 0 0  Worry too much - different things 1 1 0 1  Trouble relaxing 2 2 3 2   Restless 0 0 1 2  Easily annoyed or irritable 1 1 1 1   Afraid - awful might happen 1 1 0 0  Total GAD 7 Score 8 8 7 8   Anxiety Difficulty Somewhat difficult Somewhat difficult Very difficult Somewhat difficult    Objective:   Physical Exam  General: pt is alert, well groomed, with normal behavior and coherent thoughts and judgement. CV: Heart sounds are normal without murmur or bruit. Regular rate and rhythm. Skin is warm and dry. Cheerful affect. Making good eye contact.  Pulm: Breath sounds are equal and clear without wheezing or rhonchi bilaterally.      Today's Vitals   02/18/20 1430  BP: 116/74  Temp: 98 F (36.7 C)  TempSrc: Temporal  SpO2: 97%  Weight: 193 lb 6.4 oz (87.7 kg)   Body mass index is 33.2 kg/m.   Assessment & Plan:   Problem List Items Addressed This Visit      Other   Generalized anxiety disorder - Primary     Continue with current regimen. Encouraged reaching back out to previous counselor to schedule an appointment. Contact our office if she needs assistance.  Informed patient to seek immediate medical attention if she experiences thoughts of  hurting herself or others, pt verbalized understanding and agrees.   Encouraged patient to try Melatonin for sleep. Call back if no improvement in sleep.  Return in about 3 months (around 05/20/2020) for Anxiety.

## 2020-02-18 NOTE — Telephone Encounter (Signed)
Done

## 2020-02-18 NOTE — Telephone Encounter (Signed)
Please advise. Thank you

## 2020-02-19 ENCOUNTER — Encounter: Payer: Self-pay | Admitting: Nurse Practitioner

## 2020-02-19 NOTE — Progress Notes (Signed)
   Subjective:    Patient ID: Julie Campos, female    DOB: December 30, 1955, 64 y.o.   MRN: [id number]  HPI    Review of Systems     Objective:   Physical Exam        Assessment & Plan:

## 2020-02-25 ENCOUNTER — Telehealth: Payer: Self-pay

## 2020-02-25 NOTE — Telephone Encounter (Signed)
You do not need to call patient. I spoke with her. She will increase the Zoloft to 1 1/2 tabs (150 mg) daily. Go back to 100 mg dose if any problems. She will check into the apps as recommended. Denies any suicidal or homicidal thoughts or ideation.

## 2020-02-25 NOTE — Telephone Encounter (Signed)
I don't recommend stopping or changing the Zoloft. We can go up a little more on the dose. I recommend trying one of the online apps for counseling. Not sure about the cost. Examples are [employer] and Better Help. There are also apps to help with anxiety such as Calm.

## 2020-02-25 NOTE — Telephone Encounter (Signed)
Patient said that her insurance doesn't cover counseling.  She said that maybe her Zoloft needs to be changed since she has been on it for so long?  Patient tearful.  [facility] in [place] on [address]

## 2020-02-28 ENCOUNTER — Other Ambulatory Visit: Payer: Self-pay | Admitting: *Deleted

## 2020-03-03 ENCOUNTER — Other Ambulatory Visit: Payer: Self-pay | Admitting: Nurse Practitioner

## 2020-03-24 ENCOUNTER — Telehealth: Payer: Self-pay

## 2020-03-24 NOTE — Telephone Encounter (Signed)
Message for Julie Campos--Patient is requesting refill on Zoloft 150 mg but medication list has Zoloft 100 mg. She states been taking 150 mg for two weeks and is going to run out and would like a refill called into [facility] [facility] [address] [facility]. She has follow up appointment on 04/28/20 to discuss this medication on how its doing. Please advise

## 2020-03-27 ENCOUNTER — Telehealth: Payer: Self-pay | Admitting: Family Medicine

## 2020-03-27 MED ORDER — BUSPIRONE HCL 10 MG PO TABS: 10.0000 mg | ORAL_TABLET | Freq: Two times a day (BID) | ORAL | 0 refills | Status: DC

## 2020-03-27 NOTE — Telephone Encounter (Signed)
Medication sent to pharmacy  

## 2020-03-27 NOTE — Telephone Encounter (Signed)
Pharmacy requesting refill on Buspirone 10 mg tablets. Take one tablet po BID. Pt last seen 02/18/20. Please advise. Thank you

## 2020-03-27 NOTE — Addendum Note (Signed)
Addended by: Marlowe Shores on: 03/27/2020 04:49 PM   Modules accepted: Orders

## 2020-03-27 NOTE — Telephone Encounter (Signed)
Pls send in 60 tab, no refill. Thx. Dr. Ladona Ridgel

## 2020-04-25 ENCOUNTER — Telehealth: Payer: Self-pay | Admitting: Family Medicine

## 2020-04-25 ENCOUNTER — Other Ambulatory Visit: Payer: Self-pay | Admitting: Family Medicine

## 2020-04-25 MED ORDER — SERTRALINE HCL 100 MG PO TABS: ORAL_TABLET | ORAL | 0 refills | Status: DC

## 2020-04-25 NOTE — Telephone Encounter (Signed)
sent 

## 2020-04-25 NOTE — Telephone Encounter (Signed)
Pharmacy requesting refill on Sertraline 100mg  tablets. Take one tablet po daily. Pt last seen 02/18/20 for GAD. Please advise. Thank you

## 2020-04-25 NOTE — Addendum Note (Signed)
Addended by: Annalee Genta on: 04/25/2020 11:58 AM   Modules accepted: Orders

## 2020-04-28 ENCOUNTER — Ambulatory Visit: Payer: PRIVATE HEALTH INSURANCE | Admitting: Nurse Practitioner

## 2020-05-19 ENCOUNTER — Other Ambulatory Visit: Payer: Self-pay

## 2020-05-19 ENCOUNTER — Encounter: Payer: Self-pay | Admitting: Nurse Practitioner

## 2020-05-19 ENCOUNTER — Ambulatory Visit (INDEPENDENT_AMBULATORY_CARE_PROVIDER_SITE_OTHER): Payer: PRIVATE HEALTH INSURANCE | Admitting: Nurse Practitioner

## 2020-05-19 VITALS — BP 122/78 | HR 89 | Temp 95.9°F | Wt 191.0 lb

## 2020-05-19 DIAGNOSIS — K2100 Gastro-esophageal reflux disease with esophagitis, without bleeding: Secondary | ICD-10-CM | POA: Diagnosis not present

## 2020-05-19 DIAGNOSIS — G4700 Insomnia, unspecified: Secondary | ICD-10-CM | POA: Diagnosis not present

## 2020-05-19 DIAGNOSIS — F411 Generalized anxiety disorder: Secondary | ICD-10-CM

## 2020-05-19 MED ORDER — LIDOCAINE 5 % EX PTCH: MEDICATED_PATCH | CUTANEOUS | 0 refills | Status: DC

## 2020-05-19 MED ORDER — PANTOPRAZOLE SODIUM 40 MG PO TBEC: DELAYED_RELEASE_TABLET | ORAL | 0 refills | Status: DC

## 2020-05-19 MED ORDER — TRAZODONE HCL 50 MG PO TABS: 25.0000 mg | ORAL_TABLET | Freq: Every evening | ORAL | 0 refills | Status: DC | PRN

## 2020-05-19 NOTE — Progress Notes (Signed)
   Subjective:    Patient ID: Julie Campos, female    DOB: [birth year], 65 y.o.   MRN: [id number]  HPI Pt here to discuss Sertraline. Pt is taking 100 mg but had recently increased it to 150 mg. Pt was going to run out so she went back to 100 mg. Pt states the increased dose did seem to help. Anxiety has been high for the past few weeks. Pt having to take Clonazepam each night or else she doesn't sleep.   Review of Systems     Objective:   Physical Exam        Assessment & Plan:

## 2020-05-19 NOTE — Patient Instructions (Addendum)
Education: Avoid all anti inflammatory medications such as Naproxen (aleve), ibuprofen (advil), goodie powder or aspirin etc.   Food Choices for Gastroesophageal Reflux Disease, Adult When you have gastroesophageal reflux disease (GERD), the foods you eat and your eating habits are very important. Choosing the right foods can help ease your discomfort. Think about working with a food expert (dietitian) to help you make good choices. What are tips for following this plan? Reading food labels  Look for foods that are low in saturated fat. Foods that may help with your symptoms include: ? Foods that have less than 5% of daily value (DV) of fat. ? Foods that have 0 grams of trans fat. Cooking  Do not fry your food.  Cook your food by baking, steaming, grilling, or broiling. These are all methods that do not need a lot of fat for cooking.  To add flavor, try to use herbs that are low in spice and acidity. Meal planning  Choose healthy foods that are low in fat, such as: ? Fruits and vegetables. ? Whole grains. ? Low-fat dairy products. ? Lean meats, fish, and poultry.  Eat small meals often instead of eating 3 large meals each day. Eat your meals slowly in a place where you are relaxed. Avoid bending over or lying down until 2-3 hours after eating.  Limit high-fat foods such as fatty meats or fried foods.  Limit your intake of fatty foods, such as oils, butter, and shortening.  Avoid the following as told by your doctor: ? Foods that cause symptoms. These may be different for different people. Keep a food diary to keep track of foods that cause symptoms. ? Alcohol. ? Drinking a lot of liquid with meals. ? Eating meals during the 2-3 hours before bed.   Lifestyle  Stay at a healthy weight. Ask your doctor what weight is healthy for you. If you need to lose weight, work with your doctor to do so safely.  Exercise for at least 30 minutes on 5 or more days each week, or as told by  your doctor.  Wear loose-fitting clothes.  Do not smoke or use any products that contain nicotine or tobacco. If you need help quitting, ask your doctor.  Sleep with the head of your bed higher than your feet. Use a wedge under the mattress or blocks under the bed frame to raise the head of the bed.  Chew sugar-free gum after meals. What foods should eat? Eat a healthy, well-balanced diet of fruits, vegetables, whole grains, low-fat dairy products, lean meats, fish, and poultry. Each person is different. Foods that may cause symptoms in one person may not cause any symptoms in another person. Work with your doctor to find foods that are safe for you. The items listed above may not be a complete list of what you can eat and drink. Contact a food expert for more options.   What foods should I avoid? Limiting some of these foods may help in managing the symptoms of GERD. Everyone is different. Talk with a food expert or your doctor to help you find the exact foods to avoid, if any. Fruits Any fruits prepared with added fat. Any fruits that cause symptoms. For some people, this may include citrus fruits, such as oranges, grapefruit, pineapple, and lemons. Vegetables Deep-fried vegetables. [place] fries. Any vegetables prepared with added fat. Any vegetables that cause symptoms. For some people, this may include tomatoes and tomato products, chili peppers, onions and garlic, and horseradish. Grains  Pastries or quick breads with added fat. Meats and other proteins High-fat meats, such as fatty beef or pork, hot dogs, ribs, ham, sausage, salami, and bacon. Fried meat or protein, including fried fish and fried chicken. Nuts and nut butters, in large amounts. Dairy Whole milk and chocolate milk. Sour cream. Cream. Ice cream. Cream cheese. Milkshakes. Fats and oils Butter. Margarine. Shortening. Ghee. Beverages Coffee and tea, with or without caffeine. Carbonated beverages. Sodas. Energy drinks.  Fruit juice made with acidic fruits, such as orange or grapefruit. Tomato juice. Alcoholic drinks. Sweets and desserts Chocolate and cocoa. Donuts. Seasonings and condiments Pepper. Peppermint and spearmint. Added salt. Any condiments, herbs, or seasonings that cause symptoms. For some people, this may include curry, hot sauce, or vinegar-based salad dressings. The items listed above may not be a complete list of what you should not eat and drink. Contact a food expert for more options. Questions to ask your doctor Diet and lifestyle changes are often the first steps that are taken to manage symptoms of GERD. If diet and lifestyle changes do not help, talk with your doctor about taking medicines. Where to find more information  International Foundation for Gastrointestinal Disorders: aboutgerd.org Summary  When you have GERD, food and lifestyle choices are very important in easing your symptoms.  Eat small meals often instead of 3 large meals a day. Eat your meals slowly and in a place where you are relaxed.  Avoid bending over or lying down until 2-3 hours after eating.  Limit high-fat foods such as fatty meats or fried foods. This information is not intended to replace advice given to you by your health care provider. Make sure you discuss any questions you have with your health care provider. Document Revised: 10/11/2019 Document Reviewed: 10/11/2019 Elsevier Patient Education  2021 [employer].

## 2020-05-19 NOTE — Progress Notes (Addendum)
Subjective:    Patient ID: Julie Campos, female    DOB: 03-Nov-1955, 65 y.o.   MRN: [id number]  HPI   Pt here to discuss Sertraline. Pt is taking 100 mg but had recently increased it to 150 mg. Pt was going to run out so she went back to 100 mg. Pt states the increased dose did seem to help. Anxiety has been high for the past few weeks. Pt having to take Clonazepam each night or else she doesn't sleep.  Patient presents for concerns about insomnia. Has been on trazodone for about 4 years but stopped taking it due feeling groggy and foggy thinking. Tried melatonin for a month but also did not like feeling groggy in the mornings. Has been taking ibuprofen PM for 1 year for sleep. Stated had been working great. States she is having trouble getting adequate rest. Has also noted an increase in fatigue and anxiety.  Drinks caffeine tea once daily, spicy food twice a week, and drinks V8 daily energy drink daily.   GERD has flared up the past weeks. Acid reflux daily, mostly occurs at night and causes substernal burning chest pain. Relives the symptoms by taking nightly pepcid, prn pantoprazole, pepto bismol, and ginger ale.     Review of Systems  Constitutional: Positive for fatigue. Negative for chills, fever and unexpected weight change.       Fatigue from lack of sleep   Respiratory: Negative for cough, shortness of breath and wheezing.   Cardiovascular: Positive for chest pain.       Substernal chest pain occurring with GERD. No CP with activity.   Gastrointestinal: Negative for abdominal distention, abdominal pain, constipation, diarrhea, nausea and vomiting.  Psychiatric/Behavioral: Positive for sleep disturbance. The patient is nervous/anxious.    Depression screen [facility] 2/9 05/19/2020 11/04/2019 08/26/2019 04/02/2018 09/25/2016  Decreased Interest 2 2 0 1 1  Down, Depressed, Hopeless 2 0 2 1 1   PHQ - 2 Score 4 2 2 2 2   Altered sleeping 3 3 0 2 3  Tired, decreased energy 2 3 3 3 2    Change in appetite 0 0 0 0 0  Feeling bad or failure about yourself  0 0 0 1 2  Trouble concentrating 2 2 2  0 0  Moving slowly or fidgety/restless 0 0 1 0 0  Suicidal thoughts 0 0 0 0 1  PHQ-9 Score 11 10 8 8 10   Difficult doing work/chores Somewhat difficult Somewhat difficult Somewhat difficult - Somewhat difficult   GAD 7 : Generalized Anxiety Score 05/19/2020 02/18/2020 02/18/2020 11/04/2019  Nervous, Anxious, on Edge 3 2 2 2   Control/stop worrying 1 1 1  0  Worry too much - different things 1 1 1  0  Trouble relaxing 3 2 2 3   Restless 0 0 0 1  Easily annoyed or irritable 1 1 1 1   Afraid - awful might happen 1 1 1  0  Total GAD 7 Score 10 8 8 7   Anxiety Difficulty Somewhat difficult Somewhat difficult Somewhat difficult Very difficult       Objective:   Physical Exam Constitutional:      General: She is not in acute distress.    Appearance: Normal appearance. She is not ill-appearing.  Cardiovascular:     Rate and Rhythm: Normal rate and regular rhythm.     Pulses: Normal pulses.     Heart sounds: Normal heart sounds. No murmur heard. No gallop.      Comments:     Pulmonary:  Effort: Pulmonary effort is normal. No respiratory distress.     Breath sounds: Normal breath sounds.  Abdominal:     General: Abdomen is flat. Bowel sounds are normal. There is no distension.     Palpations: Abdomen is soft. There is no mass.     Tenderness: There is no abdominal tenderness. There is no guarding or rebound.  Neurological:     Mental Status: She is alert.  Psychiatric:        Mood and Affect: Mood normal.        Behavior: Behavior normal.        Thought Content: Thought content normal.        Judgment: Judgment normal.     Today's Vitals   05/19/20 1409  BP: 122/78  Pulse: 89  Temp: (!) 95.9 F (35.5 C)  SpO2: 99%  Weight: 191 lb (86.6 kg)   Body mass index is 32.79 kg/m.        Assessment & Plan:   Problem List Items Addressed This Visit      Digestive    Esophageal reflux   Relevant Medications   pantoprazole (PROTONIX) 40 MG tablet     Other   Generalized anxiety disorder   Relevant Medications   traZODone (DESYREL) 50 MG tablet   sertraline (ZOLOFT) 100 MG tablet   Insomnia - Primary      Meds ordered this encounter  Medications  . lidocaine (LIDODERM) 5 %    Sig: APPLY ONE PATCH TO THE SKIN AS DIRECTED**WEAR PATCH ON FOR 12 HOURS AND OFF FOR 12 HOURS**    Dispense:  30 patch    Refill:  0    Order Specific Question:   Supervising Provider    Answer:   Lilyan Punt A [[id number]]  . pantoprazole (PROTONIX) 40 MG tablet    Sig: TAKE 1 TABLET(40 MG) BY MOUTH DAILY AS NEEDED FOR ACID REFLUX    Dispense:  90 tablet    Refill:  0    **Patient requests 90 days supply**    Order Specific Question:   Supervising Provider    Answer:   Lilyan Punt A [[id number]]  . traZODone (DESYREL) 50 MG tablet    Sig: Take 0.5-1 tablets (25-50 mg total) by mouth at bedtime as needed for sleep.    Dispense:  90 tablet    Refill:  0    Order Specific Question:   Supervising Provider    Answer:   Lilyan Punt A [[id number]]  . sertraline (ZOLOFT) 100 MG tablet    Sig: Take 1 1/2 tablets (150 mg) po once daily    Dispense:  30 tablet    Refill:  2    Order Specific Question:   Supervising Provider    Answer:   Lilyan Punt A [[id number]]  . clonazePAM (KLONOPIN) 0.5 MG tablet    Sig: TAKE 1 TABLET BY MOUTH EVERY NIGHT AT BEDTIME AS NEEDED FOR ANXIETY    Dispense:  30 tablet    Refill:  2    Order Specific Question:   Supervising Provider    Answer:   Lilyan Punt A [[id number]]      Restart Trazodone since this helped insomnia long term in the past. In addition, this may help her anxiety and depression. Start with low dose and plan to titrate dose if needed. Stop med and contact office if any problems.  Encouraged patient to stop daily energy drinks due to citrus and caffeine. Avoid all NSAIDs. Given written and  verbal information on GERD. Start daily Protonix for  one month. Call back at that time if persists, sooner if worse. Return in about 3 months (around 08/16/2020) for Routine follow up.

## 2020-05-20 ENCOUNTER — Encounter: Payer: Self-pay | Admitting: Nurse Practitioner

## 2020-05-20 MED ORDER — SERTRALINE HCL 100 MG PO TABS: ORAL_TABLET | ORAL | 2 refills | Status: DC

## 2020-05-20 MED ORDER — CLONAZEPAM 0.5 MG PO TABS: ORAL_TABLET | ORAL | 2 refills | Status: DC

## 2020-05-25 ENCOUNTER — Other Ambulatory Visit: Payer: Self-pay | Admitting: Family Medicine

## 2020-08-23 ENCOUNTER — Other Ambulatory Visit: Payer: Self-pay | Admitting: Nurse Practitioner

## 2020-09-22 ENCOUNTER — Other Ambulatory Visit: Payer: Self-pay | Admitting: Nurse Practitioner

## 2020-11-21 ENCOUNTER — Telehealth: Payer: Self-pay | Admitting: Nurse Practitioner

## 2020-11-24 NOTE — Telephone Encounter (Signed)
Needs office visit before Dr. Ladona Ridgel leaves so we can refill her medications. Thanks.

## 2020-11-24 NOTE — Telephone Encounter (Signed)
Please contact patient to have her schedule appt with Dr.Taylor before she leaves. Thank you!

## 2020-12-21 NOTE — Telephone Encounter (Signed)
Have called patient twice no response 8/17 and 8/31 mail box was full

## 2020-12-26 ENCOUNTER — Other Ambulatory Visit: Payer: Self-pay | Admitting: Nurse Practitioner

## 2020-12-29 NOTE — Telephone Encounter (Signed)
Will refill meds to give her time to get established with Dr. Adriana Simas. Thanks.

## 2021-01-22 ENCOUNTER — Telehealth: Payer: Self-pay | Admitting: Nurse Practitioner

## 2021-01-24 NOTE — Telephone Encounter (Signed)
Please contact patient to have her schedule appointment with Dr.Cook. thank you!

## 2021-01-24 NOTE — Telephone Encounter (Signed)
Please ask patient to come in for an appointment in the next month to meet Dr. Adriana Simas. She has not been seen since February. It has also come across that she has had some refills by [facility]. Not sure what this is. Can you please clarify? Some of her meds and doses have changed. I will hold on refills until she lets [place] know. Thanks, Eber Jones

## 2021-01-26 NOTE — Telephone Encounter (Signed)
Patient schedule appointment for 11/21 with Dr. Adriana Simas but will be out of medication before her appointment. Please advise

## 2021-01-26 NOTE — Telephone Encounter (Signed)
Please have pt schedule appt. Thank you.

## 2021-01-30 ENCOUNTER — Telehealth: Payer: Self-pay | Admitting: Family Medicine

## 2021-01-30 ENCOUNTER — Other Ambulatory Visit: Payer: Self-pay

## 2021-01-30 ENCOUNTER — Other Ambulatory Visit: Payer: Self-pay | Admitting: Family Medicine

## 2021-01-30 MED ORDER — MOLNUPIRAVIR EUA 200MG CAPSULE: 4.0000 | ORAL_CAPSULE | Freq: Two times a day (BID) | ORAL | 0 refills | Status: AC

## 2021-01-30 MED ORDER — MOLNUPIRAVIR EUA 200MG CAPSULE: 4.0000 | ORAL_CAPSULE | Freq: Two times a day (BID) | ORAL | 0 refills | Status: DC

## 2021-01-30 NOTE — Telephone Encounter (Signed)
Left message to return call 

## 2021-01-30 NOTE — Telephone Encounter (Signed)
No taste. Bad headache yesterday but not today. Coughing up mucus; nose stopped up. Feels lousy. Symptoms began Saturday afternoon with a sore throat. No trouble breathing or shortness of breath but did feel some discomfort in left lung area. Tested positive yesterday.

## 2021-01-30 NOTE — Telephone Encounter (Signed)
Has risk factors. Recommend starting antiviral treatment. I am going to go ahead and send it in.  If she worsens, she needs to be seen.  Dr. Adriana Simas

## 2021-01-30 NOTE — Telephone Encounter (Signed)
FYI Patient called to inform Dr. Adriana Simas that her and her husband Julie Campos [birth year]) both tested positive for COVID today. He is feeling ok while she is not. She stated she would do an eVisit.  CB#  [phone]

## 2021-01-30 NOTE — Progress Notes (Signed)
Rx sent to the pharmacy for [facility].

## 2021-01-30 NOTE — Telephone Encounter (Signed)
Pt contacted and verbalized understanding. Pt needing med to go to [facility] instead of [facility]. Sent med to [facility] and cancelled script at [facility].

## 2021-02-02 ENCOUNTER — Telehealth: Payer: Self-pay | Admitting: Family Medicine

## 2021-02-02 NOTE — Telephone Encounter (Signed)
Pt contacted and verbalized understanding.  

## 2021-02-02 NOTE — Telephone Encounter (Signed)
Please advise. Thank you

## 2021-02-02 NOTE — Telephone Encounter (Signed)
Patient tested positive on 10/17 and was prescribe molnupiravir 200mg  on 10/18. She states not feeling any better and still has no taste or smell, headache and fatigue.  She wanting know how long will this last. Please advise

## 2021-02-22 ENCOUNTER — Other Ambulatory Visit: Payer: Self-pay | Admitting: Nurse Practitioner

## 2021-02-23 ENCOUNTER — Other Ambulatory Visit: Payer: Self-pay | Admitting: Nurse Practitioner

## 2021-02-23 MED ORDER — FAMOTIDINE 20 MG PO TABS: ORAL_TABLET | ORAL | 0 refills | Status: DC

## 2021-02-23 MED ORDER — PANTOPRAZOLE SODIUM 40 MG PO TBEC: DELAYED_RELEASE_TABLET | ORAL | 0 refills | Status: DC

## 2021-03-05 ENCOUNTER — Ambulatory Visit: Payer: PRIVATE HEALTH INSURANCE | Admitting: Family Medicine

## 2021-03-14 ENCOUNTER — Ambulatory Visit (INDEPENDENT_AMBULATORY_CARE_PROVIDER_SITE_OTHER): Payer: PRIVATE HEALTH INSURANCE | Admitting: Family Medicine

## 2021-03-14 ENCOUNTER — Other Ambulatory Visit: Payer: Self-pay

## 2021-03-14 DIAGNOSIS — G4700 Insomnia, unspecified: Secondary | ICD-10-CM | POA: Diagnosis not present

## 2021-03-14 DIAGNOSIS — F32A Depression, unspecified: Secondary | ICD-10-CM

## 2021-03-14 DIAGNOSIS — F411 Generalized anxiety disorder: Secondary | ICD-10-CM

## 2021-03-14 MED ORDER — FAMOTIDINE 20 MG PO TABS: ORAL_TABLET | ORAL | 3 refills | Status: DC

## 2021-03-14 MED ORDER — BUSPIRONE HCL 10 MG PO TABS: 10.0000 mg | ORAL_TABLET | Freq: Two times a day (BID) | ORAL | 3 refills | Status: DC

## 2021-03-14 MED ORDER — PANTOPRAZOLE SODIUM 40 MG PO TBEC: DELAYED_RELEASE_TABLET | ORAL | 3 refills | Status: DC

## 2021-03-14 MED ORDER — SERTRALINE HCL 100 MG PO TABS: 150.0000 mg | ORAL_TABLET | Freq: Every day | ORAL | 3 refills | Status: DC

## 2021-03-14 MED ORDER — CLONAZEPAM 0.5 MG PO TABS: 0.5000 mg | ORAL_TABLET | Freq: Every evening | ORAL | 0 refills | Status: DC | PRN

## 2021-03-14 NOTE — Progress Notes (Signed)
Subjective:  Patient ID: Julie Campos, female    DOB: [birth year]  Age: 65 y.o. MRN: [id number]  CC: Chief Complaint  Patient presents with   Gastroesophageal Reflux   Anxiety    depression    HPI:  65 year old female with GERD, insomnia, anxiety, depression presents for follow up.  Anxiety and depression Depression seems to be improving on increased dose of Zoloft.  She is on 150 mg daily. Patient still has difficulty with anxiety particularly around bedtime. Patient states that she is using Klonopin regularly at night. She takes BuSpar twice daily.  Insomnia Patient using Klonopin at night.  She states that she uses trazodone but does not like the way it makes her feel. Patient has difficulty falling asleep.  She has recently spent hours scrolling on her phone.  We discussed proper sleep hygiene.  She is curious if there is something else that we can do to help her sleep well.  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   Upper airway cough syndrome 10/29/2015   Obesity 11/27/2014   Abnormal CT scan, chest 09/21/2014   Esophageal reflux 07/09/2012   Chronic back pain 07/09/2012   Insomnia 07/09/2012   Generalized anxiety disorder 07/09/2012   Depression 07/09/2012    Social Hx   Social History   Socioeconomic History   Marital status: Married    Spouse name: Not on file   Number of children: 1   Years of education: Not on file   Highest education level: Not on file  Occupational History   Not on file  Tobacco Use   Smoking status: Never   Smokeless tobacco: Never  Substance and Sexual Activity   Alcohol use: No    Alcohol/week: 0.0 standard drinks   Drug use: No   Sexual activity: Not Currently    Birth control/protection: Post-menopausal  Other Topics Concern   Not on file  Social History Narrative   Not on file   Social Determinants of  Health   Financial Resource Strain: Not on file  Food Insecurity: Not on file  Transportation Needs: Not on file  Physical Activity: Not on file  Stress: Not on file  Social Connections: Not on file    Review of Systems  Constitutional: Negative.   Psychiatric/Behavioral:  Positive for sleep disturbance. The patient is nervous/anxious.     Objective:  BP 114/77   Pulse 82   Temp (!) 97.4 F (36.3 C)   Ht 5\' 4"  (1.626 m)   Wt 187 lb (84.8 kg)   SpO2 98%   BMI 32.10 kg/m   BP/Weight 03/14/2021 05/19/2020 02/18/2020  Systolic BP 114 122 116  Diastolic BP 77 78 74  Wt. (Lbs) 187 191 193.4  BMI 32.1 32.79 33.2    Physical Exam Vitals and nursing note reviewed.  Constitutional:      General: She is not in acute distress.    Appearance: Normal appearance. She is not ill-appearing.  HENT:     Head: Normocephalic and atraumatic.  Eyes:     General:        Right eye: No discharge.        Left eye: No discharge.     Conjunctiva/sclera: Conjunctivae normal.  Cardiovascular:     Rate and Rhythm: Normal rate and regular rhythm.  Pulmonary:     Effort: Pulmonary effort is normal.     Breath  sounds: Normal breath sounds. No wheezing or rales.  Neurological:     Mental Status: She is alert.    Lab Results  Component Value Date   WBC 7.3 09/14/2019   HGB 12.1 09/14/2019   HCT 37.7 09/14/2019   PLT 413 (H) 09/14/2019   GLUCOSE 98 09/14/2019   CHOL 218 (H) 01/19/2019   TRIG 90 01/19/2019   HDL 45 01/19/2019   LDLCALC 157 (H) 01/19/2019   ALT 30 09/14/2019   AST 24 09/14/2019   NA 133 (L) 09/14/2019   K 3.9 09/14/2019   CL 99 09/14/2019   CREATININE 0.65 09/14/2019   BUN 14 09/14/2019   CO2 28 09/14/2019   TSH 3.010 01/19/2019     Assessment & Plan:   Problem List Items Addressed This Visit       Other   Insomnia    We had a lengthy discussion about sleep hygiene.  Patient has poor sleep hygiene which contributes to her insomnia.  Patient states that she  will try to use trazodone again and see how she does.      Generalized anxiety disorder    Continue BuSpar.  Advised to use Klonopin sparingly as it can be habit-forming.      Relevant Medications   busPIRone (BUSPAR) 10 MG tablet   sertraline (ZOLOFT) 100 MG tablet   Depression    Improving on Zoloft 150 mg daily.  Continue.  Refilled today.      Relevant Medications   busPIRone (BUSPAR) 10 MG tablet   sertraline (ZOLOFT) 100 MG tablet    Meds ordered this encounter  Medications   busPIRone (BUSPAR) 10 MG tablet    Sig: Take 1 tablet (10 mg total) by mouth 2 (two) times daily.    Dispense:  180 tablet    Refill:  3   clonazePAM (KLONOPIN) 0.5 MG tablet    Sig: Take 1 tablet (0.5 mg total) by mouth at bedtime as needed for anxiety.    Dispense:  30 tablet    Refill:  0   famotidine (PEPCID) 20 MG tablet    Sig: TAKE 1 TABLET(20 MG) BY MOUTH AT BEDTIME    Dispense:  90 tablet    Refill:  3   pantoprazole (PROTONIX) 40 MG tablet    Sig: TAKE 1 TABLET(40 MG) BY MOUTH DAILY AS NEEDED FOR ACID REFLUX    Dispense:  90 tablet    Refill:  3    **Patient requests 90 days supply**   sertraline (ZOLOFT) 100 MG tablet    Sig: Take 1.5 tablets (150 mg total) by mouth daily.    Dispense:  135 tablet    Refill:  3    Follow-up:  Return in about 6 months (around 09/11/2021) for Either with me or Eber Jones.Everlene Other DO [facility]

## 2021-03-14 NOTE — Patient Instructions (Signed)
Good sleep hygiene.  Medications as prescribed.  Follow up in 6 months.  Take care  Dr. Adriana Simas

## 2021-03-15 NOTE — Assessment & Plan Note (Signed)
Continue BuSpar.  Advised to use Klonopin sparingly as it can be habit-forming.

## 2021-03-15 NOTE — Assessment & Plan Note (Signed)
We had a lengthy discussion about sleep hygiene.  Patient has poor sleep hygiene which contributes to her insomnia.  Patient states that she will try to use trazodone again and see how she does.

## 2021-03-15 NOTE — Assessment & Plan Note (Addendum)
Improving on Zoloft 150 mg daily.  Continue.  Refilled today.

## 2021-03-16 ENCOUNTER — Telehealth: Payer: Self-pay | Admitting: Family Medicine

## 2021-03-16 NOTE — Telephone Encounter (Signed)
Patient is requesting refill on her Vitamin-D called into [facility]

## 2021-03-19 NOTE — Telephone Encounter (Signed)
Julie Other G, DO   Last vitamin D level was normal. High dose Vitamin D is done for 12 weeks (typically). Based on her most recent vitamin D level, I would recommend 2000 IU of Vitamin D daily (OTC).

## 2021-03-19 NOTE — Telephone Encounter (Signed)
Telephone call no answer 

## 2021-03-26 NOTE — Telephone Encounter (Signed)
Left message for a return call for details  

## 2021-04-03 NOTE — Telephone Encounter (Signed)
Left message to return call 

## 2021-04-03 NOTE — Telephone Encounter (Signed)
Patient returned call and was informed of her vitamin d level and recommendations.

## 2021-04-27 ENCOUNTER — Other Ambulatory Visit: Payer: Self-pay | Admitting: Family Medicine

## 2021-07-28 ENCOUNTER — Other Ambulatory Visit: Payer: Self-pay | Admitting: Family Medicine

## 2021-09-11 ENCOUNTER — Encounter: Payer: Self-pay | Admitting: Family Medicine

## 2021-09-11 ENCOUNTER — Ambulatory Visit (INDEPENDENT_AMBULATORY_CARE_PROVIDER_SITE_OTHER): Payer: PRIVATE HEALTH INSURANCE | Admitting: Family Medicine

## 2021-09-11 VITALS — BP 105/69 | HR 66 | Temp 97.7°F | Wt 185.4 lb

## 2021-09-11 DIAGNOSIS — G4700 Insomnia, unspecified: Secondary | ICD-10-CM | POA: Diagnosis not present

## 2021-09-11 DIAGNOSIS — Z13220 Encounter for screening for lipoid disorders: Secondary | ICD-10-CM

## 2021-09-11 DIAGNOSIS — Z6831 Body mass index (BMI) 31.0-31.9, adult: Secondary | ICD-10-CM

## 2021-09-11 DIAGNOSIS — E669 Obesity, unspecified: Secondary | ICD-10-CM

## 2021-09-11 DIAGNOSIS — I2510 Atherosclerotic heart disease of native coronary artery without angina pectoris: Secondary | ICD-10-CM | POA: Insufficient documentation

## 2021-09-11 DIAGNOSIS — Z130 Encounter for screening for diseases of the blood and blood-forming organs and certain disorders involving the immune mechanism: Secondary | ICD-10-CM | POA: Diagnosis not present

## 2021-09-11 NOTE — Assessment & Plan Note (Signed)
Has not responded well to trazodone. Had adverse effects to [place] in the past. Is on Klonopin for anxiety. Advised to take 1 tablet at night (1 hour before bedtime) to see if this will improved insomnia. Advised need for regular sleep schedule and good sleep hygiene.

## 2021-09-11 NOTE — Progress Notes (Signed)
Subjective:  Patient ID: Julie Campos, female    DOB: 10/07/1955  Age: 66 y.o. MRN: [id number]  CC: Chief Complaint  Patient presents with   Depression    Pt states she is unable to sleep; usually 4 am-6 am when she does go to sleep and drags the next day. Pt has tried Trazodone but did not like how it made her feel (pt was staggering). Pt took 1/3 of Trazodone last night; Advil PM works good     HPI:  66 year old female with GERD, Obesity, GAD, Depression, Insomnia presents with complaints of insomnia.  Has not responded well to Trazodone. It makes her dizzy and "hits too fast". Uses Advil PM some and this seems to help. Does not have a regular schedule regarding sleep. Stays up late and will nap during the day sometimes. States that she has turning her mind off. Would like to discuss options for treatment today.  Patient Active Problem List   Diagnosis Date Noted   CAD (coronary artery disease) 09/11/2021   Vitamin D deficiency 10/02/2016   Upper airway cough syndrome 10/29/2015   Obesity 11/27/2014   Abnormal CT scan, chest 09/21/2014   Esophageal reflux 07/09/2012   Insomnia 07/09/2012   Generalized anxiety disorder 07/09/2012   Depression 07/09/2012    Social Hx   Social History   Socioeconomic History   Marital status: Married    Spouse name: Not on file   Number of children: 1   Years of education: Not on file   Highest education level: Not on file  Occupational History   Not on file  Tobacco Use   Smoking status: Never   Smokeless tobacco: Never  Substance and Sexual Activity   Alcohol use: No    Alcohol/week: 0.0 standard drinks   Drug use: No   Sexual activity: Not Currently    Birth control/protection: Post-menopausal  Other Topics Concern   Not on file  Social History Narrative   Not on file   Social Determinants of Health   Financial Resource Strain: Not on file  Food Insecurity: Not on file  Transportation Needs: Not on file  Physical  Activity: Not on file  Stress: Not on file  Social Connections: Not on file    Review of Systems  Constitutional: Negative.   Psychiatric/Behavioral:  Positive for sleep disturbance.     Objective:  BP 105/69   Pulse 66   Temp 97.7 F (36.5 C)   Wt 185 lb 6.4 oz (84.1 kg)   SpO2 98%   BMI 31.82 kg/m      09/11/2021    3:03 PM 03/14/2021    2:36 PM 05/19/2020    2:09 PM  BP/Weight  Systolic BP 086 578 469  Diastolic BP 69 77 78  Wt. (Lbs) 185.4 187 191  BMI 31.82 kg/m2 32.1 kg/m2 32.79 kg/m2    Physical Exam Constitutional:      General: She is not in acute distress.    Appearance: Normal appearance. She is obese.  HENT:     Head: Normocephalic and atraumatic.  Pulmonary:     Effort: Pulmonary effort is normal. No respiratory distress.  Neurological:     Mental Status: She is alert.  Psychiatric:        Mood and Affect: Mood normal.        Behavior: Behavior normal.    Lab Results  Component Value Date   WBC 7.3 09/14/2019   HGB 12.1 09/14/2019   HCT  37.7 09/14/2019   PLT 413 (H) 09/14/2019   GLUCOSE 98 09/14/2019   CHOL 218 (H) 01/19/2019   TRIG 90 01/19/2019   HDL 45 01/19/2019   LDLCALC 157 (H) 01/19/2019   ALT 30 09/14/2019   AST 24 09/14/2019   NA 133 (L) 09/14/2019   K 3.9 09/14/2019   CL 99 09/14/2019   CREATININE 0.65 09/14/2019   BUN 14 09/14/2019   CO2 28 09/14/2019   TSH 3.010 01/19/2019     Assessment & Plan:   Problem List Items Addressed This Visit       Other   Obesity   Relevant Orders   CMP14+EGFR   Insomnia    Has not responded well to trazodone. Had adverse effects to [place] in the past. Is on Klonopin for anxiety. Advised to take 1 tablet at night (1 hour before bedtime) to see if this will improved insomnia. Advised need for regular sleep schedule and good sleep hygiene.        Other Visit Diagnoses     Screening for deficiency anemia    -  Primary   Relevant Orders   CBC   Screening for lipid disorders        Relevant Orders   Lipid panel      Follow-up:  Return in about 3 months (around 12/12/2021).  [place]

## 2021-09-11 NOTE — Patient Instructions (Signed)
Use 1 tablet of the klonopin 1 hour before bed.  Avoid use the phone, caffeine, etc at least an hour before bed.  Labs ordered. Get them drawn when you can.  Follow up in 3 months or sooner if needed.

## 2021-09-12 LAB — CMP14+EGFR
ALT: 13 IU/L (ref 0–32)
AST: 16 IU/L (ref 0–40)
Albumin/Globulin Ratio: 1.7 (ref 1.2–2.2)
Albumin: 4.5 g/dL (ref 3.8–4.8)
Alkaline Phosphatase: 115 IU/L (ref 44–121)
BUN/Creatinine Ratio: 11 — ABNORMAL LOW (ref 12–28)
BUN: 9 mg/dL (ref 8–27)
Bilirubin Total: 0.3 mg/dL (ref 0.0–1.2)
CO2: 23 mmol/L (ref 20–29)
Calcium: 9.7 mg/dL (ref 8.7–10.3)
Chloride: 100 mmol/L (ref 96–106)
Creatinine, Ser: 0.8 mg/dL (ref 0.57–1.00)
Globulin, Total: 2.6 g/dL (ref 1.5–4.5)
Glucose: 94 mg/dL (ref 70–99)
Potassium: 4.4 mmol/L (ref 3.5–5.2)
Sodium: 139 mmol/L (ref 134–144)
Total Protein: 7.1 g/dL (ref 6.0–8.5)
eGFR: 82 mL/min/{1.73_m2} (ref >59)

## 2021-09-12 LAB — LIPID PANEL
Chol/HDL Ratio: 4.4 ratio (ref 0.0–4.4)
Cholesterol, Total: 220 mg/dL — ABNORMAL HIGH (ref 100–199)
HDL: 50 mg/dL (ref >39)
LDL Chol Calc (NIH): 152 mg/dL — ABNORMAL HIGH (ref 0–99)
Triglycerides: 103 mg/dL (ref 0–149)
VLDL Cholesterol Cal: 18 mg/dL (ref 5–40)

## 2021-09-12 LAB — CBC
Hematocrit: 38.5 % (ref 34.0–46.6)
Hemoglobin: 12.7 g/dL (ref 11.1–15.9)
MCH: 31.4 pg (ref 26.6–33.0)
MCHC: 33 g/dL (ref 31.5–35.7)
MCV: 95 fL (ref 79–97)
Platelets: 309 10*3/uL (ref 150–450)
RBC: 4.05 x10E6/uL (ref 3.77–5.28)
RDW: 11.9 % (ref 11.7–15.4)
WBC: 5.5 10*3/uL (ref 3.4–10.8)

## 2021-09-14 ENCOUNTER — Other Ambulatory Visit: Payer: Self-pay | Admitting: Family Medicine

## 2021-09-14 MED ORDER — ROSUVASTATIN CALCIUM 10 MG PO TABS: 10.0000 mg | ORAL_TABLET | Freq: Every day | ORAL | 3 refills | Status: DC

## 2021-09-19 ENCOUNTER — Telehealth: Payer: Self-pay

## 2021-09-19 ENCOUNTER — Other Ambulatory Visit: Payer: Self-pay | Admitting: Family Medicine

## 2021-09-19 MED ORDER — MOXIFLOXACIN HCL 0.5 % OP SOLN: OPHTHALMIC | 0 refills | Status: DC

## 2021-09-19 NOTE — Telephone Encounter (Signed)
Patient advised of md message and recommendations. Verbalized understanding. 

## 2021-09-19 NOTE — Telephone Encounter (Signed)
Caller name:Jariya Idolina Primer   On DPR? :Yes  Call back [phone]  Provider they see: Adriana Simas   Reason for call:Pt is calling has stye in her eye she was wanting antibiotic I told her she have to be seen and she wants to know if there is something over the counter that can be called in?

## 2021-09-24 ENCOUNTER — Telehealth: Payer: Self-pay | Admitting: Family Medicine

## 2021-09-24 MED ORDER — MOXIFLOXACIN HCL 0.5 % OP SOLN: OPHTHALMIC | 0 refills | Status: DC

## 2021-09-24 NOTE — Telephone Encounter (Signed)
Prescription sent electronically to pharmacy as requested. 

## 2021-09-24 NOTE — Telephone Encounter (Signed)
Patient is requesting eye drops be sent to [facility] because [facility] was too expensive it was going to cost her $66 dollars there and she can get them at [facility]  for $20.

## 2021-09-27 ENCOUNTER — Telehealth: Payer: Self-pay | Admitting: Family Medicine

## 2021-09-27 MED ORDER — ROSUVASTATIN CALCIUM 10 MG PO TABS: 10.0000 mg | ORAL_TABLET | Freq: Every day | ORAL | 3 refills | Status: DC

## 2021-09-27 NOTE — Telephone Encounter (Signed)
Left vm that medication was sent to [facility]. Any questions or concerns to call us back.

## 2021-09-27 NOTE — Telephone Encounter (Signed)
Patient would like her Rosuvastatin 10 mg sent to [facility] instead of [facility] its cheaper

## 2021-11-22 ENCOUNTER — Other Ambulatory Visit: Payer: Self-pay | Admitting: Family Medicine

## 2021-12-11 ENCOUNTER — Ambulatory Visit (INDEPENDENT_AMBULATORY_CARE_PROVIDER_SITE_OTHER): Payer: PRIVATE HEALTH INSURANCE | Admitting: Family Medicine

## 2021-12-11 DIAGNOSIS — H00015 Hordeolum externum left lower eyelid: Secondary | ICD-10-CM

## 2021-12-11 DIAGNOSIS — G4700 Insomnia, unspecified: Secondary | ICD-10-CM

## 2021-12-11 MED ORDER — MOXIFLOXACIN HCL 0.5 % OP SOLN: 1.0000 [drp] | Freq: Three times a day (TID) | OPHTHALMIC | 0 refills | Status: DC

## 2021-12-11 NOTE — Patient Instructions (Signed)
Watch the screen time.  Medication as prescribed for the stye. Warm compresses.  Follow up in 6 months.   Take care  Dr. Adriana Simas

## 2021-12-12 DIAGNOSIS — H00019 Hordeolum externum unspecified eye, unspecified eyelid: Secondary | ICD-10-CM | POA: Insufficient documentation

## 2021-12-12 NOTE — Assessment & Plan Note (Signed)
Discussed sleep hygiene and to limit screen time in the evening.  Discussed other pharmacologic treatments. Patient wants to wait on additional medication at this time.

## 2021-12-12 NOTE — Progress Notes (Signed)
Subjective:  Patient ID: Julie Campos, female    DOB: May 20, 1955  Age: 66 y.o. MRN: [id number]  CC: Chief Complaint  Patient presents with   3 month follow up     Still having sleeping problem - trouble falling asleep - usually cant fall asleep until 4 to 6 am    Stye    Left eye today    HPI:  66 year old female presents with the above complaints.  Continues to have issues with insomnia. This seems to be mainly due to patients screen time. Has tried multiple medications without improvement. Still having trouble falling asleep. Will discuss sleep hygiene again today.  Also reports that she woke up this morning with a stye of the left lower eyelid. Some discharge.  Patient Active Problem List   Diagnosis Date Noted   Stye 12/12/2021   CAD (coronary artery disease) 09/11/2021   Vitamin D deficiency 10/02/2016   Obesity 11/27/2014   Esophageal reflux 07/09/2012   Insomnia 07/09/2012   Generalized anxiety disorder 07/09/2012   Depression 07/09/2012    Social Hx   Social History   Socioeconomic History   Marital status: Married    Spouse name: Not on file   Number of children: 1   Years of education: Not on file   Highest education level: Not on file  Occupational History   Not on file  Tobacco Use   Smoking status: Never   Smokeless tobacco: Never  Substance and Sexual Activity   Alcohol use: No    Alcohol/week: 0.0 standard drinks of alcohol   Drug use: No   Sexual activity: Not Currently    Birth control/protection: Post-menopausal  Other Topics Concern   Not on file  Social History Narrative   Not on file   Social Determinants of Health   Financial Resource Strain: Not on file  Food Insecurity: Not on file  Transportation Needs: Not on file  Physical Activity: Not on file  Stress: Not on file  Social Connections: Not on file    Review of Systems Per HPI  Objective:  BP 118/72   Pulse 79   Temp 98.2 F (36.8 C)   Ht 5\' 4"  (1.626 m)    Wt 182 lb (82.6 kg)   SpO2 99%   BMI 31.24 kg/m      12/11/2021    3:41 PM 09/11/2021    3:03 PM 03/14/2021    2:36 PM  BP/Weight  Systolic BP 118 105 114  Diastolic BP 72 69 77  Wt. (Lbs) 182 185.4 187  BMI 31.24 kg/m2 31.82 kg/m2 32.1 kg/m2    Physical Exam Vitals and nursing note reviewed.  Constitutional:      General: She is not in acute distress.    Appearance: Normal appearance. She is not ill-appearing.  HENT:     Head: Normocephalic and atraumatic.  Eyes:     General:        Left eye: Discharge present.    Conjunctiva/sclera: Conjunctivae normal.     Comments: Stye noted of the left lower eyelid.  Cardiovascular:     Rate and Rhythm: Normal rate and regular rhythm.  Pulmonary:     Effort: Pulmonary effort is normal.     Breath sounds: Normal breath sounds. No wheezing or rales.  Neurological:     Mental Status: She is alert.     Lab Results  Component Value Date   WBC 5.5 09/11/2021   HGB 12.7 09/11/2021   HCT  38.5 09/11/2021   PLT 309 09/11/2021   GLUCOSE 94 09/11/2021   CHOL 220 (H) 09/11/2021   TRIG 103 09/11/2021   HDL 50 09/11/2021   LDLCALC 152 (H) 09/11/2021   ALT 13 09/11/2021   AST 16 09/11/2021   NA 139 09/11/2021   K 4.4 09/11/2021   CL 100 09/11/2021   CREATININE 0.80 09/11/2021   BUN 9 09/11/2021   CO2 23 09/11/2021   TSH 3.010 01/19/2019     Assessment & Plan:   Problem List Items Addressed This Visit       Other   Insomnia    Discussed sleep hygiene and to limit screen time in the evening.  Discussed other pharmacologic treatments. Patient wants to wait on additional medication at this time.      Stye    Warm compresses and Vigamox.        Meds ordered this encounter  Medications   moxifloxacin (VIGAMOX) 0.5 % ophthalmic solution    Sig: Place 1 drop into the left eye 3 (three) times daily for 7 days.    Dispense:  3 mL    Refill:  0    Follow-up:  Return in about 6 months (around 06/13/2022).  Everlene Other  DO [facility]

## 2021-12-12 NOTE — Assessment & Plan Note (Signed)
Warm compresses and Vigamox.

## 2021-12-13 ENCOUNTER — Telehealth: Payer: Self-pay | Admitting: Family Medicine

## 2021-12-13 DIAGNOSIS — I2510 Atherosclerotic heart disease of native coronary artery without angina pectoris: Secondary | ICD-10-CM

## 2021-12-13 DIAGNOSIS — Z79899 Other long term (current) drug therapy: Secondary | ICD-10-CM

## 2021-12-13 DIAGNOSIS — Z13220 Encounter for screening for lipoid disorders: Secondary | ICD-10-CM

## 2021-12-13 NOTE — Telephone Encounter (Signed)
[place], [place], DO  [place], [place], LPN Please advised patient that during review of her chart, CT done in 2016 revealed evidence of heart disease (coronary calcifications/plaque).  Recommend referral to Cardiology.   Dr. Adriana Simas   Left message to return call

## 2021-12-21 ENCOUNTER — Other Ambulatory Visit: Payer: Self-pay

## 2021-12-21 ENCOUNTER — Telehealth: Payer: Self-pay | Admitting: Family Medicine

## 2021-12-21 MED ORDER — MOXIFLOXACIN HCL 0.5 % OP SOLN
1.0000 [drp] | Freq: Three times a day (TID) | OPHTHALMIC | 0 refills | Status: AC
Start: 2021-12-21 — End: 2022-01-10

## 2021-12-21 MED ORDER — ROSUVASTATIN CALCIUM 10 MG PO TABS: 10.0000 mg | ORAL_TABLET | Freq: Every day | ORAL | 3 refills | Status: DC

## 2021-12-21 NOTE — Addendum Note (Signed)
Addended by: Marlowe Shores on: 12/21/2021 04:41 PM   Modules accepted: Orders

## 2021-12-21 NOTE — Telephone Encounter (Signed)
error 

## 2021-12-21 NOTE — Telephone Encounter (Signed)
Pt returned call. Pt verbalized understanding. Referral placed. Pt states she was not aware that Crestor was sent to pharmacy. Pt requested refill of vigamox-sent vigamox and crestor to [facility]. Also printed off CT results and pt is coming by office to pick up those results. Pt would like to know if she can take Melatonin with sertraline and buspar to help her sleep. Could the medications she is on for anxiety/depression be the cause of her not being able to sleep? Please advise. Thank you

## 2021-12-21 NOTE — Telephone Encounter (Signed)
Pt would also like to have labs. Pt had labs completed 09/11/21 CMP14+EGFR, LIPID and CBC. Pt has appt in Feb 2024 

## 2021-12-25 NOTE — Telephone Encounter (Signed)
Lab orders placed and pt is aware 

## 2021-12-25 NOTE — Addendum Note (Signed)
Addended by: Marlowe Shores on: 12/25/2021 04:48 PM   Modules accepted: Orders

## 2021-12-31 ENCOUNTER — Ambulatory Visit (INDEPENDENT_AMBULATORY_CARE_PROVIDER_SITE_OTHER): Payer: PRIVATE HEALTH INSURANCE | Admitting: Family Medicine

## 2021-12-31 ENCOUNTER — Encounter: Payer: Self-pay | Admitting: Family Medicine

## 2021-12-31 VITALS — BP 138/84 | HR 91 | Temp 97.9°F | Wt 181.6 lb

## 2021-12-31 DIAGNOSIS — E7800 Pure hypercholesterolemia, unspecified: Secondary | ICD-10-CM | POA: Diagnosis not present

## 2021-12-31 DIAGNOSIS — I2510 Atherosclerotic heart disease of native coronary artery without angina pectoris: Secondary | ICD-10-CM | POA: Diagnosis not present

## 2021-12-31 DIAGNOSIS — K219 Gastro-esophageal reflux disease without esophagitis: Secondary | ICD-10-CM | POA: Insufficient documentation

## 2021-12-31 NOTE — Assessment & Plan Note (Signed)
Lipid panel today to assess response to Crestor.

## 2021-12-31 NOTE — Patient Instructions (Signed)
Lipid panel today.  Best of luck at cardiology.  Take care  Dr. Lacinda Axon

## 2021-12-31 NOTE — Progress Notes (Signed)
Subjective:  Patient ID: Julie Campos, female    DOB: 1956/04/09  Age: 66 y.o. MRN: [id number]  CC: Chief Complaint  Patient presents with   Discussion    Pt arrives to discuss CT scan that was completed by in 2016. Pt is seeing cardiology on 01/03/22    HPI:  66 year old female with GERD, Insomnia, Anxiety and Depression, and CAD per CT findings presents to discuss previous CT findings.  Patient had a previous CT chest in 2016. It revealed distal left main and left anterior descending coronary artery disease.  II will discuss these findings with her today and answer questions. I have referred her to cardiology. She has an upcoming appt. No current or recent chest pain or SOB. Has a family history of CAD.    Lipids uncontrolled. Has been started on statin. Labs drawn earlier today. Lipid panel to see response to statin therapy.   Patient Active Problem List   Diagnosis Date Noted   GERD (gastroesophageal reflux disease) 12/31/2021   Pure hypercholesterolemia 12/31/2021   CAD (coronary artery disease) 09/11/2021   Vitamin D deficiency 10/02/2016   Obesity 11/27/2014   Insomnia 07/09/2012   Generalized anxiety disorder 07/09/2012   Depression 07/09/2012    Social Hx   Social History   Socioeconomic History   Marital status: Married    Spouse name: Not on file   Number of children: 1   Years of education: Not on file   Highest education level: Not on file  Occupational History   Not on file  Tobacco Use   Smoking status: Never   Smokeless tobacco: Never  Substance and Sexual Activity   Alcohol use: No    Alcohol/week: 0.0 standard drinks of alcohol   Drug use: No   Sexual activity: Not Currently    Birth control/protection: Post-menopausal  Other Topics Concern   Not on file  Social History Narrative   Not on file   Social Determinants of Health   Financial Resource Strain: Not on file  Food Insecurity: Not on file  Transportation Needs: Not on file   Physical Activity: Not on file  Stress: Not on file  Social Connections: Not on file    Review of Systems Per HPI  Objective:  BP 138/84   Pulse 91   Temp 97.9 F (36.6 C)   Wt 181 lb 9.6 oz (82.4 kg)   SpO2 97%   BMI 31.17 kg/m      12/31/2021    3:49 PM 12/11/2021    3:41 PM 09/11/2021    3:03 PM  BP/Weight  Systolic BP 283 151 761  Diastolic BP 84 72 69  Wt. (Lbs) 181.6 182 185.4  BMI 31.17 kg/m2 31.24 kg/m2 31.82 kg/m2    Physical Exam Vitals and nursing note reviewed.  Constitutional:      General: She is not in acute distress.    Appearance: Normal appearance.  HENT:     Head: Normocephalic and atraumatic.  Cardiovascular:     Rate and Rhythm: Normal rate and regular rhythm.  Pulmonary:     Effort: Pulmonary effort is normal.     Breath sounds: Normal breath sounds. No wheezing or rales.  Neurological:     Mental Status: She is alert.  Psychiatric:        Mood and Affect: Mood normal.        Behavior: Behavior normal.    Lab Results  Component Value Date   WBC 5.5 09/11/2021  HGB 12.7 09/11/2021   HCT 38.5 09/11/2021   PLT 309 09/11/2021   GLUCOSE 94 09/11/2021   CHOL 220 (H) 09/11/2021   TRIG 103 09/11/2021   HDL 50 09/11/2021   LDLCALC 152 (H) 09/11/2021   ALT 13 09/11/2021   AST 16 09/11/2021   NA 139 09/11/2021   K 4.4 09/11/2021   CL 100 09/11/2021   CREATININE 0.80 09/11/2021   BUN 9 09/11/2021   CO2 23 09/11/2021   TSH 3.010 01/19/2019     Assessment & Plan:   Problem List Items Addressed This Visit       Cardiovascular and Mediastinum   CAD (coronary artery disease) - Primary    Awaiting cardiology appt. Questions answered today. Recommend risk factor modification. BP is controlled. On statin. Advised healthy diet. Needs regular physical activity.        Other   Pure hypercholesterolemia    Lipid panel today to assess response to Crestor.      Follow-up:  As previously scheduled  Everlene Other DO [facility]  Family Medicine

## 2021-12-31 NOTE — Assessment & Plan Note (Signed)
Awaiting cardiology appt. Questions answered today. Recommend risk factor modification. BP is controlled. On statin. Advised healthy diet. Needs regular physical activity.

## 2022-01-01 LAB — CMP14+EGFR
ALT: 12 IU/L (ref 0–32)
AST: 13 IU/L (ref 0–40)
Albumin/Globulin Ratio: 1.5 (ref 1.2–2.2)
Albumin: 4.3 g/dL (ref 3.9–4.9)
Alkaline Phosphatase: 114 IU/L (ref 44–121)
BUN/Creatinine Ratio: 12 (ref 12–28)
BUN: 10 mg/dL (ref 8–27)
Bilirubin Total: 0.4 mg/dL (ref 0.0–1.2)
CO2: 25 mmol/L (ref 20–29)
Calcium: 9.4 mg/dL (ref 8.7–10.3)
Chloride: 102 mmol/L (ref 96–106)
Creatinine, Ser: 0.81 mg/dL (ref 0.57–1.00)
Globulin, Total: 2.9 g/dL (ref 1.5–4.5)
Glucose: 87 mg/dL (ref 70–99)
Potassium: 4.2 mmol/L (ref 3.5–5.2)
Sodium: 140 mmol/L (ref 134–144)
Total Protein: 7.2 g/dL (ref 6.0–8.5)
eGFR: 81 mL/min/{1.73_m2} (ref >59)

## 2022-01-01 LAB — CBC WITH DIFFERENTIAL/PLATELET
Basophils Absolute: 0.1 10*3/uL (ref 0.0–0.2)
Basos: 1 %
EOS (ABSOLUTE): 0.1 10*3/uL (ref 0.0–0.4)
Eos: 2 %
Hematocrit: 39 % (ref 34.0–46.6)
Hemoglobin: 12.6 g/dL (ref 11.1–15.9)
Immature Grans (Abs): 0 10*3/uL (ref 0.0–0.1)
Immature Granulocytes: 0 %
Lymphocytes Absolute: 2.4 10*3/uL (ref 0.7–3.1)
Lymphs: 44 %
MCH: 31 pg (ref 26.6–33.0)
MCHC: 32.3 g/dL (ref 31.5–35.7)
MCV: 96 fL (ref 79–97)
Monocytes Absolute: 0.4 10*3/uL (ref 0.1–0.9)
Monocytes: 7 %
Neutrophils Absolute: 2.5 10*3/uL (ref 1.4–7.0)
Neutrophils: 46 %
Platelets: 310 10*3/uL (ref 150–450)
RBC: 4.07 x10E6/uL (ref 3.77–5.28)
RDW: 12 % (ref 11.7–15.4)
WBC: 5.5 10*3/uL (ref 3.4–10.8)

## 2022-01-01 LAB — LIPID PANEL
Chol/HDL Ratio: 5.3 ratio — ABNORMAL HIGH (ref 0.0–4.4)
Cholesterol, Total: 210 mg/dL — ABNORMAL HIGH (ref 100–199)
HDL: 40 mg/dL (ref >39)
LDL Chol Calc (NIH): 153 mg/dL — ABNORMAL HIGH (ref 0–99)
Triglycerides: 94 mg/dL (ref 0–149)
VLDL Cholesterol Cal: 17 mg/dL (ref 5–40)

## 2022-01-03 ENCOUNTER — Ambulatory Visit: Payer: PRIVATE HEALTH INSURANCE | Admitting: Cardiology

## 2022-02-05 ENCOUNTER — Telehealth: Payer: Self-pay | Admitting: Family Medicine

## 2022-02-05 NOTE — Telephone Encounter (Signed)
Pt reports feeling tired for about 5 days. Pt would like iron level and vitamin levels checked. Please advise. Thank you!

## 2022-02-06 ENCOUNTER — Other Ambulatory Visit: Payer: Self-pay | Admitting: Family Medicine

## 2022-02-06 DIAGNOSIS — R5383 Other fatigue: Secondary | ICD-10-CM

## 2022-02-06 NOTE — Telephone Encounter (Signed)
Pt contacted and verbalized understanding.  

## 2022-03-12 ENCOUNTER — Other Ambulatory Visit: Payer: Self-pay | Admitting: Family Medicine

## 2022-03-12 ENCOUNTER — Other Ambulatory Visit: Payer: Self-pay

## 2022-03-12 MED ORDER — ROSUVASTATIN CALCIUM 10 MG PO TABS: 10.0000 mg | ORAL_TABLET | Freq: Every day | ORAL | 3 refills | Status: DC

## 2022-03-12 MED ORDER — FAMOTIDINE 20 MG PO TABS: ORAL_TABLET | ORAL | 3 refills | Status: DC

## 2022-03-12 MED ORDER — PANTOPRAZOLE SODIUM 40 MG PO TBEC: DELAYED_RELEASE_TABLET | ORAL | 3 refills | Status: DC

## 2022-03-12 MED ORDER — BUSPIRONE HCL 10 MG PO TABS: 10.0000 mg | ORAL_TABLET | Freq: Two times a day (BID) | ORAL | 3 refills | Status: DC

## 2022-03-12 MED ORDER — SERTRALINE HCL 100 MG PO TABS: 150.0000 mg | ORAL_TABLET | Freq: Every day | ORAL | 3 refills | Status: DC

## 2022-03-13 ENCOUNTER — Ambulatory Visit: Payer: Medicare HMO | Attending: Cardiology | Admitting: Cardiology

## 2022-03-13 ENCOUNTER — Telehealth: Payer: Self-pay | Admitting: Cardiology

## 2022-03-13 ENCOUNTER — Encounter: Payer: Self-pay | Admitting: Cardiology

## 2022-03-13 ENCOUNTER — Other Ambulatory Visit: Payer: Self-pay

## 2022-03-13 VITALS — BP 114/60 | HR 88 | Ht 66.0 in | Wt 184.2 lb

## 2022-03-13 DIAGNOSIS — R079 Chest pain, unspecified: Secondary | ICD-10-CM | POA: Diagnosis not present

## 2022-03-13 DIAGNOSIS — I2510 Atherosclerotic heart disease of native coronary artery without angina pectoris: Secondary | ICD-10-CM | POA: Diagnosis not present

## 2022-03-13 MED ORDER — ONDANSETRON 4 MG PO TBDP: 4.0000 mg | ORAL_TABLET | Freq: Three times a day (TID) | ORAL | 0 refills | Status: DC | PRN

## 2022-03-13 MED ORDER — CLONAZEPAM 0.5 MG PO TABS: ORAL_TABLET | ORAL | 0 refills | Status: DC

## 2022-03-13 MED ORDER — ASPIRIN 81 MG PO TBEC: 81.0000 mg | DELAYED_RELEASE_TABLET | Freq: Every day | ORAL | 1 refills | Status: AC

## 2022-03-13 NOTE — Progress Notes (Signed)
Clinical Summary Julie Campos is a 66 y.o.female seen as new consult, referred by Dr Adriana Simas for the following medical problems.  CAD - 09/2014 CT chest: atherosclerosis, distal LM and LAD disease - occasional chest pains at times, though infrequent - left sided, hard to describe character pain. Can be up to 4/10 in severity. No other associated symptoms. Lasts about 20 minutes. Not positional - can have different type of chest pain, 2-3 episodes of tightness  - enjoys walking on beach often long distances, denies any consistent exertional symptoms CAD risk factors: hyperlipidemia, father MI/stents mid 41s      Past Medical History:  Diagnosis Date   Allergy    Arthritis    Chronic back pain    Depression    Insomnia    Reflux      No Known Allergies   Current Outpatient Medications  Medication Sig Dispense Refill   busPIRone (BUSPAR) 10 MG tablet Take 1 tablet (10 mg total) by mouth 2 (two) times daily. 180 tablet 3   clobetasol (TEMOVATE) 0.05 % external solution      clonazePAM (KLONOPIN) 0.5 MG tablet TAKE 1 TABLET BY MOUTH AT BEDTIME AS NEEDED FOR ANXIETY 90 tablet 0   famotidine (PEPCID) 20 MG tablet TAKE 1 TABLET(20 MG) BY MOUTH AT BEDTIME 90 tablet 3   Ibuprofen-diphenhydrAMINE Cit (ADVIL PM PO) Take by mouth.     pantoprazole (PROTONIX) 40 MG tablet TAKE 1 TABLET(40 MG) BY MOUTH DAILY AS NEEDED FOR ACID REFLUX 90 tablet 3   rosuvastatin (CRESTOR) 10 MG tablet Take 1 tablet (10 mg total) by mouth daily. 90 tablet 3   sertraline (ZOLOFT) 100 MG tablet Take 1.5 tablets (150 mg total) by mouth daily. 135 tablet 3   No current facility-administered medications for this visit.     No past surgical history on file.   No Known Allergies    Family History  Problem Relation Age of Onset   Osteoporosis Mother    Heart disease Father    Pulmonary fibrosis Father    Lung cancer Paternal Grandfather        smoked   Lung cancer Paternal Uncle        smoked    Breast cancer Paternal Aunt    Lung disease Paternal Grandmother    Lung cancer Paternal Aunt      Social History Ms. Hendricks reports that she has never smoked. She has never used smokeless tobacco. Ms. Tallo reports no history of alcohol use.   Review of Systems CONSTITUTIONAL: No weight loss, fever, chills, weakness or fatigue.  HEENT: Eyes: No visual loss, blurred vision, double vision or yellow sclerae.No hearing loss, sneezing, congestion, runny nose or sore throat.  SKIN: No rash or itching.  CARDIOVASCULAR: per hpi RESPIRATORY: No shortness of breath, cough or sputum.  GASTROINTESTINAL: No anorexia, nausea, vomiting or diarrhea. No abdominal pain or blood.  GENITOURINARY: No burning on urination, no polyuria NEUROLOGICAL: No headache, dizziness, syncope, paralysis, ataxia, numbness or tingling in the extremities. No change in bowel or bladder control.  MUSCULOSKELETAL: No muscle, back pain, joint pain or stiffness.  LYMPHATICS: No enlarged nodes. No history of splenectomy.  PSYCHIATRIC: No history of depression or anxiety.  ENDOCRINOLOGIC: No reports of sweating, cold or heat intolerance. No polyuria or polydipsia.  Marland Kitchen   Physical Examination Today's Vitals   03/13/22 1502  BP: 114/60  Pulse: 88  SpO2: 95%  Weight: 184 lb 3.2 oz (83.6 kg)  Height: 5'  6" (1.676 m)   Body mass index is 29.73 kg/m.  Gen: resting comfortably, no acute distress HEENT: no scleral icterus, pupils equal round and reactive, no palptable cervical adenopathy,  CV Resp: Clear to auscultation bilaterally GI: abdomen is soft, non-tender, non-distended, normal bowel sounds, no hepatosplenomegaly MSK: extremities are warm, no edema.  Skin: warm, no rash Neuro:  no focal deficits Psych: appropriate affect      Assessment and Plan  1.Coronary atherosclerosis - noted on 2016 CT scan including distal LM and LAD - recent chest pains unclear etiology - given known atherosclerosis and  symptoms would plan for exercise nuclear stress to further evaluate - she is already on statin, would start ASA 81mg  daily.  - EKG today shows SR, no ischemic changes  F/u pending stress test results    , M.D.,.

## 2022-03-13 NOTE — Patient Instructions (Signed)
Medication Instructions:  Your physician has recommended you make the following change in your medication:  Start aspirin 81 mg once a day Continue all other medications as directed  Labwork: none  Testing/Procedures: Your physician has requested that you have en exercise stress myoview. For further information please visit [email or web address] Please follow instruction sheet, as given.   Follow-Up:  Your physician recommends that you schedule a follow-up appointment in: Follow up pending results  Any Other Special Instructions Will Be Listed Below (If Applicable).  If you need a refill on your cardiac medications before your next appointment, please call your pharmacy.

## 2022-03-13 NOTE — Telephone Encounter (Signed)
Checking percert on the following patient for testing scheduled at [facility].   EXERCISE STRESS  03/26/2022

## 2022-03-26 ENCOUNTER — Ambulatory Visit (HOSPITAL_COMMUNITY)
Admission: RE | Admit: 2022-03-26 | Discharge: 2022-03-26 | Disposition: A | Discharge status: Home / Self Care | Payer: Medicare HMO | Source: Ambulatory Visit | Attending: Cardiology | Admitting: Cardiology

## 2022-03-26 DIAGNOSIS — R079 Chest pain, unspecified: Secondary | ICD-10-CM | POA: Insufficient documentation

## 2022-03-26 LAB — NM MYOCAR MULTI W/SPECT W/WALL MOTION / EF
Angina Index: 0
Duke Treadmill Score: 3
Estimated workload: 4.6
Exercise duration (min): 2 min
Exercise duration (sec): 31 s
LV dias vol: 63 mL (ref 46–106)
LV sys vol: 19 mL
MPHR: 155 {beats}/min
Nuc Stress EF: 70 %
Peak HR: 148 {beats}/min
Percent HR: 95 %
RATE: 0.4
RPE: 13
Rest HR: 74 {beats}/min
Rest Nuclear Isotope Dose: 10 mCi
SDS: 0
SRS: 5
SSS: 5
ST Depression (mm): 0 mm
Stress Nuclear Isotope Dose: 33 mCi
TID: 1.07

## 2022-03-26 MED ORDER — REGADENOSON 0.4 MG/5ML IV SOLN
INTRAVENOUS | Status: AC
  Filled 2022-03-26: qty 5

## 2022-03-26 MED ORDER — TECHNETIUM TC 99M TETROFOSMIN IV KIT
30.0000 | PACK | Freq: Once | INTRAVENOUS | Status: AC | PRN
  Administered 2022-03-26: 33 via INTRAVENOUS

## 2022-03-26 MED ORDER — TECHNETIUM TC 99M TETROFOSMIN IV KIT
10.0000 | PACK | Freq: Once | INTRAVENOUS | Status: AC | PRN
  Administered 2022-03-26: 10 via INTRAVENOUS

## 2022-03-26 MED ORDER — SODIUM CHLORIDE FLUSH 0.9 % IV SOLN
INTRAVENOUS | Status: AC
  Administered 2022-03-26: 10 mL via INTRAVENOUS
  Filled 2022-03-26: qty 10

## 2022-03-27 ENCOUNTER — Telehealth: Payer: Self-pay

## 2022-03-27 LAB — IRON,TIBC AND FERRITIN PANEL
Ferritin: 22 ng/mL (ref 15–150)
Iron Saturation: 31 % (ref 15–55)
Iron: 100 ug/dL (ref 27–139)
Total Iron Binding Capacity: 322 ug/dL (ref 250–450)
UIBC: 222 ug/dL (ref 118–369)

## 2022-03-27 LAB — VITAMIN B12: Vitamin B-12: 539 pg/mL (ref 232–1245)

## 2022-03-27 LAB — VITAMIN D 25 HYDROXY (VIT D DEFICIENCY, FRACTURES): Vit D, 25-Hydroxy: 24.4 ng/mL — ABNORMAL LOW (ref 30.0–100.0)

## 2022-03-27 LAB — TSH: TSH: 3.02 u[IU]/mL (ref 0.450–4.500)

## 2022-03-27 NOTE — Telephone Encounter (Signed)
Patient notified and verbalized understanding. Patient had no questions at this time.  

## 2022-03-27 NOTE — Telephone Encounter (Signed)
-----   Message from Antoine Poche, MD sent at 03/27/2022 12:56 PM EST ----- Normal stress test, no evidence of any significant blockages in the arteries of the heart. Should f/u with pcp to discuss noncardiac causes of her symptoms, can see [place] back 1 year  Dominga Ferry MD

## 2022-04-01 ENCOUNTER — Telehealth: Payer: Self-pay | Admitting: Family Medicine

## 2022-04-01 ENCOUNTER — Other Ambulatory Visit: Payer: Self-pay | Admitting: Family Medicine

## 2022-04-01 MED ORDER — TRAMADOL HCL 50 MG PO TABS: 50.0000 mg | ORAL_TABLET | Freq: Three times a day (TID) | ORAL | 0 refills | Status: AC | PRN

## 2022-04-01 NOTE — Telephone Encounter (Signed)
Patient is requesting pain relaxer for back pain she states had stress test done last week and it made this dic in her back shft . She states in very severe back pain. [employer]

## 2022-04-02 NOTE — Telephone Encounter (Signed)
Mychart message sent to patient.

## 2022-04-02 NOTE — Telephone Encounter (Signed)
Left message to return call 

## 2022-04-02 NOTE — Telephone Encounter (Signed)
Tommie Sams, DO     Rx sent for the patient.

## 2022-04-02 NOTE — Telephone Encounter (Signed)
Patient notified

## 2022-05-03 ENCOUNTER — Encounter: Payer: Medicare HMO | Admitting: Nurse Practitioner

## 2022-06-11 ENCOUNTER — Ambulatory Visit: Payer: Medicare HMO | Admitting: Family Medicine

## 2022-06-11 VITALS — BP 114/72 | HR 79 | Temp 97.5°F | Ht 66.0 in | Wt 187.0 lb

## 2022-06-11 DIAGNOSIS — I2510 Atherosclerotic heart disease of native coronary artery without angina pectoris: Secondary | ICD-10-CM

## 2022-06-11 DIAGNOSIS — E7800 Pure hypercholesterolemia, unspecified: Secondary | ICD-10-CM | POA: Diagnosis not present

## 2022-06-11 DIAGNOSIS — M5416 Radiculopathy, lumbar region: Secondary | ICD-10-CM

## 2022-06-11 DIAGNOSIS — F411 Generalized anxiety disorder: Secondary | ICD-10-CM | POA: Diagnosis not present

## 2022-06-11 DIAGNOSIS — G4700 Insomnia, unspecified: Secondary | ICD-10-CM

## 2022-06-11 MED ORDER — CLONAZEPAM 0.5 MG PO TABS: ORAL_TABLET | ORAL | 1 refills | Status: DC

## 2022-06-11 NOTE — Patient Instructions (Signed)
Xray at the hospital. We will call with results  Medication refilled.  Take care  Dr. Lacinda Axon

## 2022-06-12 DIAGNOSIS — M5416 Radiculopathy, lumbar region: Secondary | ICD-10-CM | POA: Insufficient documentation

## 2022-06-12 NOTE — Progress Notes (Signed)
Subjective:  Patient ID: Julie Campos, female    DOB: May 03, 1955  Age: 67 y.o. MRN: [id number]  CC: Chief Complaint  Patient presents with   6 month follow up     Refll clonazepam  rx, nerve pain coming from back disc from treadmill stress test took tramadol    HPI:  67 year old female with coronary disease per CT scan, GERD, hyperlipidemia, anxiety depression, insomnia presents for follow-up.  Patient reports that she has had ongoing issues with lumbar radiculopathy since having a treadmill stress test.  She states that she has had issues with her back previously.  She has seen a chiropractor previously.  Patient still struggling with insomnia.  We have tried trazodone in the past without good results.  She has had adverse effects to Ambien previously.  The majority of medications for insomnia are not recommended in those age 33 and older.  Patient requesting refill on clonazepam.   Patient Active Problem List   Diagnosis Date Noted   Lumbar radiculopathy 06/12/2022   GERD (gastroesophageal reflux disease) 12/31/2021   Pure hypercholesterolemia 12/31/2021   CAD (coronary artery disease) 09/11/2021   Vitamin D deficiency 10/02/2016   Obesity 11/27/2014   Insomnia 07/09/2012   Generalized anxiety disorder 07/09/2012   Depression 07/09/2012    Social Hx   Social History   Socioeconomic History   Marital status: Married    Spouse name: Not on file   Number of children: 1   Years of education: Not on file   Highest education level: Not on file  Occupational History   Not on file  Tobacco Use   Smoking status: Never   Smokeless tobacco: Never  Substance and Sexual Activity   Alcohol use: No    Alcohol/week: 0.0 standard drinks of alcohol   Drug use: No   Sexual activity: Not Currently    Birth control/protection: Post-menopausal  Other Topics Concern   Not on file  Social History Narrative   Not on file   Social Determinants of Health   Financial  Resource Strain: Not on file  Food Insecurity: Not on file  Transportation Needs: Not on file  Physical Activity: Not on file  Stress: Not on file  Social Connections: Not on file    Review of Systems Per HPI  Objective:  BP 114/72   Pulse 79   Temp (!) 97.5 F (36.4 C)   Ht '5\' 6"'$  (1.676 m)   Wt 187 lb (84.8 kg)   SpO2 98%   BMI 30.18 kg/m      06/11/2022    2:57 PM 03/13/2022    3:02 PM 12/31/2021    3:49 PM  BP/Weight  Systolic BP [id number]  Diastolic BP 72 60 84  Wt. (Lbs) 187 184.2 181.6  BMI 30.18 kg/m2 29.73 kg/m2 31.17 kg/m2    Physical Exam Vitals and nursing note reviewed.  Constitutional:      General: She is not in acute distress.    Appearance: Normal appearance.  HENT:     Head: Normocephalic and atraumatic.  Cardiovascular:     Rate and Rhythm: Normal rate and regular rhythm.  Pulmonary:     Effort: Pulmonary effort is normal.     Breath sounds: No wheezing, rhonchi or rales.  Neurological:     Mental Status: She is alert.  Psychiatric:        Mood and Affect: Mood normal.        Behavior: Behavior normal.  Lab Results  Component Value Date   WBC 5.5 12/31/2021   HGB 12.6 12/31/2021   HCT 39.0 12/31/2021   PLT 310 12/31/2021   GLUCOSE 87 12/31/2021   CHOL 210 (H) 12/31/2021   TRIG 94 12/31/2021   HDL 40 12/31/2021   LDLCALC 153 (H) 12/31/2021   ALT 12 12/31/2021   AST 13 12/31/2021   NA 140 12/31/2021   K 4.2 12/31/2021   CL 102 12/31/2021   CREATININE 0.81 12/31/2021   BUN 10 12/31/2021   CO2 25 12/31/2021   TSH 3.020 03/26/2022     Assessment & Plan:   Problem List Items Addressed This Visit       Cardiovascular and Mediastinum   CAD (coronary artery disease)    Negative stress test.  Asymptomatic currently.  Continue statin and aspirin.         Nervous and Auditory   Lumbar radiculopathy - Primary    X-ray for further evaluation.      Relevant Medications   clonazePAM (KLONOPIN) 0.5 MG tablet   Other  Relevant Orders   DG Lumbar Spine Complete     Other   Pure hypercholesterolemia    Continue statin.      Insomnia    Chronic problem.  Advised patient to consider discontinuation of clonazepam and trial of another medication for sleep.  Also recommended sleep study.      Generalized anxiety disorder    Klonopin as directed.       Meds ordered this encounter  Medications   clonazePAM (KLONOPIN) 0.5 MG tablet    Sig: TAKE 1 TABLET BY MOUTH AT BEDTIME AS NEEDED FOR ANXIETY    Dispense:  90 tablet    Refill:  1    Follow-up:  Return in about 6 months (around 12/10/2022).  [place]

## 2022-06-12 NOTE — Assessment & Plan Note (Addendum)
Negative stress test.  Asymptomatic currently.  Continue statin and aspirin.

## 2022-06-12 NOTE — Assessment & Plan Note (Signed)
Chronic problem.  Advised patient to consider discontinuation of clonazepam and trial of another medication for sleep.  Also recommended sleep study.

## 2022-06-12 NOTE — Assessment & Plan Note (Signed)
Continue statin. 

## 2022-06-12 NOTE — Assessment & Plan Note (Signed)
Klonopin as directed.

## 2022-06-12 NOTE — Assessment & Plan Note (Signed)
X-ray for further evaluation.

## 2022-08-05 ENCOUNTER — Telehealth: Payer: Self-pay | Admitting: Family Medicine

## 2022-08-05 NOTE — Telephone Encounter (Signed)
Patient is in [place] due to death in her family she is requesting a 10 day supply of refills on her medications. She didn't have enough until she comes home  sertraline (ZOLOFT) 100 MG tablet     .pantoprazole (PROTONIX) 40 MG tablet famotidine (PEPCID) 20 MG tablet  busPIRone (BUSPAR) 10 MG tablet . [facility] [place] [phone] phone number

## 2022-08-06 MED ORDER — FAMOTIDINE 20 MG PO TABS: ORAL_TABLET | ORAL | 0 refills | Status: DC

## 2022-08-06 MED ORDER — SERTRALINE HCL 100 MG PO TABS: 150.0000 mg | ORAL_TABLET | Freq: Every day | ORAL | 0 refills | Status: DC

## 2022-08-06 MED ORDER — PANTOPRAZOLE SODIUM 40 MG PO TBEC: DELAYED_RELEASE_TABLET | ORAL | 0 refills | Status: DC

## 2022-08-06 MED ORDER — BUSPIRONE HCL 10 MG PO TABS: 10.0000 mg | ORAL_TABLET | Freq: Two times a day (BID) | ORAL | 0 refills | Status: DC

## 2022-08-06 NOTE — Telephone Encounter (Signed)
Prescriptions sent electronically to pharmacy. Patient notified. °

## 2022-08-06 NOTE — Telephone Encounter (Signed)
Patient is calling back to check on prescription refills

## 2022-09-15 ENCOUNTER — Other Ambulatory Visit: Payer: Self-pay | Admitting: Family Medicine

## 2022-12-09 ENCOUNTER — Ambulatory Visit (INDEPENDENT_AMBULATORY_CARE_PROVIDER_SITE_OTHER): Payer: Medicare HMO | Admitting: Nurse Practitioner

## 2022-12-09 VITALS — BP 108/74 | HR 74 | Ht 66.0 in | Wt 184.0 lb

## 2022-12-09 DIAGNOSIS — M5416 Radiculopathy, lumbar region: Secondary | ICD-10-CM | POA: Diagnosis not present

## 2022-12-09 DIAGNOSIS — F411 Generalized anxiety disorder: Secondary | ICD-10-CM

## 2022-12-09 DIAGNOSIS — G4700 Insomnia, unspecified: Secondary | ICD-10-CM | POA: Diagnosis not present

## 2022-12-09 NOTE — Progress Notes (Unsigned)
   Subjective:    Patient ID: Julie Campos, female    DOB: 01/31/1956, 67 y.o.   MRN: [id number]  HPI Follow up for poor sleep, anxiety Patient is in a cycle of staying up most the night and sleeping during the day.  Drinks a large amount of caffeinated beverages during the day, states it seems to hit her late at night.  Has tried Advil PM and Klonopin with no relief.  States her anxiety seems to be under good control with current dose of sertraline along with BuSpar. Also has chronic lumbar discomfort see previous note 06/11/2022.  Pain is localized in the low back area.  Has tried multiple interventions, has not tried lidocaine patches.  No change in bowel or bladder habits.  Review of Systems  HENT:  Negative for sore throat and trouble swallowing.   Respiratory:  Negative for cough, chest tightness and shortness of breath.   Cardiovascular:  Negative for chest pain and leg swelling.  Musculoskeletal:  Positive for back pain.      06/11/2022    3:03 PM  Depression screen PHQ 2/9  Decreased Interest 0  Down, Depressed, Hopeless 1  PHQ - 2 Score 1  Altered sleeping 3  Tired, decreased energy 2  Change in appetite 1  Feeling bad or failure about yourself  0  Trouble concentrating 1  Moving slowly or fidgety/restless 0  Suicidal thoughts 0  PHQ-9 Score 8  Difficult doing work/chores Somewhat difficult        Objective:   Physical Exam NAD.  Alert, oriented.  Calm cheerful affect.  Smiling.  Speech clear.  Making good eye contact.  Thyroid nontender to palpation, no mass or goiter noted.  Lungs clear.  Heart regular rate rhythm.  Generalized tenderness noted in the lower lumbar area.  Normal gait. Today's Vitals   12/09/22 1545  BP: 108/74  Pulse: 74  SpO2: 96%  Weight: 184 lb (83.5 kg)  Height: 5\' 6"  (1.676 m)   Body mass index is 29.7 kg/m.        Assessment & Plan:   Problem List Items Addressed This Visit       Nervous and Auditory   Lumbar  radiculopathy     Other   Generalized anxiety disorder - Primary   Insomnia   Meds ordered this encounter  Medications   ondansetron (ZOFRAN-ODT) 4 MG disintegrating tablet    Sig: Take one tab po every 8 hours prn N/V    Dispense:  24 tablet    Refill:  0   lidocaine (LIDODERM) 5 %    Sig: Place 1 patch onto the skin daily. Remove & Discard patch within 12 hours or as directed by MD    Dispense:  30 patch    Refill:  0   Trial of lidocaine patches to low back area as directed.  Call back if no improvement. Continue other medications as directed.  Feel that her anxiety and depression may be slightly worse due to lack of quality sleep.  Discussed sleep hygiene and how to gradually get her body to sleep at night so she can be awake during the day.  Slowly decrease the amount of caffeine intake. Also recommend that patient schedule preventive health physical this fall. Return in about 6 months (around 06/11/2023).

## 2022-12-10 ENCOUNTER — Ambulatory Visit: Payer: Medicare HMO | Admitting: Family Medicine

## 2022-12-10 ENCOUNTER — Encounter: Payer: Self-pay | Admitting: Nurse Practitioner

## 2022-12-10 MED ORDER — LIDOCAINE 5 % EX PTCH: 1.0000 | MEDICATED_PATCH | CUTANEOUS | 0 refills | Status: DC

## 2022-12-10 MED ORDER — ONDANSETRON 4 MG PO TBDP: ORAL_TABLET | ORAL | 0 refills | Status: DC

## 2022-12-13 ENCOUNTER — Other Ambulatory Visit: Payer: Self-pay | Admitting: Family Medicine

## 2022-12-19 ENCOUNTER — Other Ambulatory Visit: Payer: Self-pay | Admitting: Nurse Practitioner

## 2022-12-19 MED ORDER — CLONAZEPAM 0.5 MG PO TABS: ORAL_TABLET | ORAL | 2 refills | Status: DC

## 2022-12-26 ENCOUNTER — Telehealth: Payer: Self-pay | Admitting: Family Medicine

## 2022-12-26 ENCOUNTER — Other Ambulatory Visit: Payer: Self-pay | Admitting: Nurse Practitioner

## 2022-12-26 MED ORDER — MOXIFLOXACIN HCL 0.5 % OP SOLN: 1.0000 [drp] | Freq: Three times a day (TID) | OPHTHALMIC | 0 refills | Status: DC

## 2022-12-26 NOTE — Telephone Encounter (Signed)
Patient is requesting refill on eye drops for cyst under left eye [facility] [place]

## 2023-01-28 ENCOUNTER — Ambulatory Visit: Payer: Medicare HMO | Admitting: Family Medicine

## 2023-01-28 VITALS — BP 118/76 | HR 85 | Temp 98.0°F | Wt 184.8 lb

## 2023-01-28 DIAGNOSIS — N3001 Acute cystitis with hematuria: Secondary | ICD-10-CM

## 2023-01-28 DIAGNOSIS — N390 Urinary tract infection, site not specified: Secondary | ICD-10-CM | POA: Insufficient documentation

## 2023-01-28 LAB — POCT URINALYSIS DIP (CLINITEK)
Bilirubin, UA: NEGATIVE
Glucose, UA: NEGATIVE mg/dL
Ketones, POC UA: NEGATIVE mg/dL
Nitrite, UA: POSITIVE — AB
POC PROTEIN,UA: 30 — AB
Spec Grav, UA: 1.01 (ref 1.010–1.025)
Urobilinogen, UA: 0.2 U/dL
pH, UA: 6.5 (ref 5.0–8.0)

## 2023-01-28 MED ORDER — CEPHALEXIN 500 MG PO CAPS
500.0000 mg | ORAL_CAPSULE | Freq: Two times a day (BID) | ORAL | 0 refills | Status: DC
Start: 2023-01-28 — End: 2023-09-22

## 2023-01-28 NOTE — Assessment & Plan Note (Signed)
UA consistent with UTI.  Sending culture.  Placing on Keflex.

## 2023-01-28 NOTE — Progress Notes (Signed)
Subjective:  Patient ID: Julie Campos, female    DOB: 14-Oct-1955  Age: 67 y.o. MRN: [id number]  CC:  UTI  HPI:  67 year old female presents for evaluation of the above.   Symptoms started Saturday night.  She reports urinary pressure and dysuria.  No fever.  No abdominal pain.  No flank pain.  No relieving factors.  Patient Active Problem List   Diagnosis Date Noted   UTI (urinary tract infection) 01/28/2023   Lumbar radiculopathy 06/12/2022   GERD (gastroesophageal reflux disease) 12/31/2021   Pure hypercholesterolemia 12/31/2021   CAD (coronary artery disease) 09/11/2021   Vitamin D deficiency 10/02/2016   Obesity 11/27/2014   Insomnia 07/09/2012   Generalized anxiety disorder 07/09/2012   Depression 07/09/2012    Social Hx   Social History   Socioeconomic History   Marital status: Married    Spouse name: Not on file   Number of children: 1   Years of education: Not on file   Highest education level: Some college, no degree  Occupational History   Not on file  Tobacco Use   Smoking status: Never   Smokeless tobacco: Never  Substance and Sexual Activity   Alcohol use: No    Alcohol/week: 0.0 standard drinks of alcohol   Drug use: No   Sexual activity: Not Currently    Birth control/protection: Post-menopausal  Other Topics Concern   Not on file  Social History Narrative   Not on file   Social Determinants of Health   Financial Resource Strain: Low Risk  (01/28/2023)   Overall Financial Resource Strain (CARDIA)    Difficulty of Paying Living Expenses: Not very hard  Food Insecurity: No Food Insecurity (01/28/2023)   Hunger Vital Sign    Worried About Running Out of Food in the Last Year: Never true    Ran Out of Food in the Last Year: Never true  Transportation Needs: No Transportation Needs (01/28/2023)   PRAPARE - Administrator, Civil Service (Medical): No    Lack of Transportation (Non-Medical): No  Physical Activity:  Insufficiently Active (01/28/2023)   Exercise Vital Sign    Days of Exercise per Week: 3 days    Minutes of Exercise per Session: 20 min  Stress: Stress Concern Present (01/28/2023)   [employer] of Occupational Health - Occupational Stress Questionnaire    Feeling of Stress : Very much  Social Connections: Socially Integrated (01/28/2023)   Social Connection and Isolation Panel [NHANES]    Frequency of Communication with Friends and Family: More than three times a week    Frequency of Social Gatherings with Friends and Family: Twice a week    Attends Religious Services: More than 4 times per year    Active Member of [employer] or Organizations: Yes    Attends Engineer, structural: More than 4 times per year    Marital Status: Married    Review of Systems Per HPI  Objective:  BP 118/76   Pulse 85   Temp 98 F (36.7 C) (Oral)   Wt 184 lb 12.8 oz (83.8 kg)   SpO2 97%   BMI 29.83 kg/m      01/28/2023    4:19 PM 12/09/2022    3:45 PM 06/11/2022    2:57 PM  BP/Weight  Systolic BP 118 108 114  Diastolic BP 76 74 72  Wt. (Lbs) 184.8 184 187  BMI 29.83 kg/m2 29.7 kg/m2 30.18 kg/m2    Physical Exam Vitals and  nursing note reviewed.  Constitutional:      General: She is not in acute distress.    Appearance: Normal appearance.  HENT:     Head: Normocephalic and atraumatic.  Eyes:     General:        Right eye: No discharge.        Left eye: No discharge.     Conjunctiva/sclera: Conjunctivae normal.  Cardiovascular:     Rate and Rhythm: Normal rate and regular rhythm.  Pulmonary:     Effort: Pulmonary effort is normal.     Breath sounds: Normal breath sounds.  Abdominal:     General: There is no distension.     Palpations: Abdomen is soft.     Tenderness: There is no abdominal tenderness.  Neurological:     Mental Status: She is alert.  Psychiatric:        Mood and Affect: Mood normal.        Behavior: Behavior normal.     Lab Results  Component  Value Date   WBC 5.5 12/31/2021   HGB 12.6 12/31/2021   HCT 39.0 12/31/2021   PLT 310 12/31/2021   GLUCOSE 87 12/31/2021   CHOL 210 (H) 12/31/2021   TRIG 94 12/31/2021   HDL 40 12/31/2021   LDLCALC 153 (H) 12/31/2021   ALT 12 12/31/2021   AST 13 12/31/2021   NA 140 12/31/2021   K 4.2 12/31/2021   CL 102 12/31/2021   CREATININE 0.81 12/31/2021   BUN 10 12/31/2021   CO2 25 12/31/2021   TSH 3.020 03/26/2022     Assessment & Plan:   Problem List Items Addressed This Visit       Genitourinary   UTI (urinary tract infection) - Primary    UA consistent with UTI.  Sending culture.  Placing on Keflex.      Relevant Medications   cephALEXin (KEFLEX) 500 MG capsule   Other Relevant Orders   POCT URINALYSIS DIP (CLINITEK) (Completed)   Urine Culture   Meds ordered this encounter  Medications   cephALEXin (KEFLEX) 500 MG capsule    Sig: Take 1 capsule (500 mg total) by mouth 2 (two) times daily.    Dispense:  14 capsule    Refill:  0   Aneshia Jacquet DO [facility]

## 2023-02-01 LAB — URINE CULTURE

## 2023-02-05 ENCOUNTER — Encounter: Payer: Self-pay | Admitting: Family Medicine

## 2023-02-19 ENCOUNTER — Telehealth: Payer: Self-pay | Admitting: *Deleted

## 2023-02-19 NOTE — Telephone Encounter (Signed)
Source  Julie Campos (Patient)   Subject  Julie Campos (Patient)   Topic  General - Other    Communication  Reason for CRM: patient is wanting to know if she can send in her urine sample with her husband tomorrow when he comes in tomorrow for his appt

## 2023-02-20 NOTE — Telephone Encounter (Signed)
Patient did not bring in urine.

## 2023-02-20 NOTE — Telephone Encounter (Signed)
Tommie Sams, DO     Sure.

## 2023-03-07 ENCOUNTER — Other Ambulatory Visit: Payer: Self-pay | Admitting: Family Medicine

## 2023-03-10 ENCOUNTER — Telehealth: Payer: Self-pay | Admitting: *Deleted

## 2023-03-10 ENCOUNTER — Other Ambulatory Visit: Payer: Self-pay | Admitting: Family Medicine

## 2023-03-10 MED ORDER — CLONAZEPAM 0.5 MG PO TABS: ORAL_TABLET | ORAL | 5 refills | Status: DC

## 2023-03-10 NOTE — Telephone Encounter (Signed)
Copied from CRM [id number]. Topic: Clinical - Medication Refill >> Mar 07, 2023  3:15 PM Amy B wrote: Most Recent Primary Care Visit:  Provider: Tommie Sams  Department: [facility]  Visit Type: OFFICE VISIT  Date: 01/28/2023  Medication: sertraline (ZOLOFT) 100 MG tablet; busPIRone (BUSPAR) 10 MG tablet; pantoprazole (PROTONIX) 40 MG; famotidine (PEPCID) 20 MG tablet; clonazePAM (KLONOPIN) 0.5 MG tablet  Has the patient contacted their pharmacy? Yes (Agent: If no, request that the patient contact the pharmacy for the refill. If patient does not wish to contact the pharmacy document the reason why and proceed with request.) (Agent: If yes, when and what did the pharmacy advise?)  Is this the correct pharmacy for this prescription? Yes If no, delete pharmacy and type the correct one.  This is the patient's preferred pharmacy:   [facility] - [place], [place] - [facility] AT Portal to Registered Caremark Sites [address] [place] [address] Phone: [phone] Fax: [phone]   Has the prescription been filled recently? No  Is the patient out of the medication? Yes  Has the patient been seen for an appointment in the last year OR does the patient have an upcoming appointment? Yes  Can we respond through MyChart? Yes  Agent: Please be advised that Rx refills may take up to 3 business days. We ask that you follow-up with your pharmacy.

## 2023-06-06 ENCOUNTER — Other Ambulatory Visit: Payer: Self-pay | Admitting: Family Medicine

## 2023-06-06 NOTE — Telephone Encounter (Signed)
Last Fill: 12/10/22  Last OV: 01/28/23 Next OV: 06/24/23  Routing to provider for review/authorization.   Copied from CRM [id number]. Topic: Clinical - Medication Refill >> Jun 06, 2023  4:35 PM Priscille Loveless wrote: Most Recent Primary Care Visit:  Provider: Tommie Sams  Department: [facility]  Visit Type: OFFICE VISIT  Date: 01/28/2023  Medication: ondansetron (ZOFRAN-ODT) 4 MG disintegrating tablet   Has the patient contacted their pharmacy? Yes   Is this the correct pharmacy for this prescription? Yes .  This is the patient's preferred pharmacy:  [facility] - [place], [place] - [facility] AT Portal to Registered Caremark Sites [address] [place] [address] Phone: [phone] Fax: [phone]    Has the prescription been filled recently? Yes  Is the patient out of the medication? Yes  Has the patient been seen for an appointment in the last year OR does the patient have an upcoming appointment? No  Can we respond through MyChart? Yes  Agent: Please be advised that Rx refills may take up to 3 business days. We ask that you follow-up with your pharmacy.

## 2023-06-09 MED ORDER — ONDANSETRON 4 MG PO TBDP: ORAL_TABLET | ORAL | 0 refills | Status: DC

## 2023-06-12 ENCOUNTER — Other Ambulatory Visit: Payer: Self-pay | Admitting: Nurse Practitioner

## 2023-06-12 MED ORDER — ONDANSETRON 4 MG PO TBDP: ORAL_TABLET | ORAL | 2 refills | Status: DC

## 2023-06-24 ENCOUNTER — Ambulatory Visit: Payer: Medicare HMO | Admitting: Family Medicine

## 2023-07-15 ENCOUNTER — Ambulatory Visit (INDEPENDENT_AMBULATORY_CARE_PROVIDER_SITE_OTHER): Admitting: Family Medicine

## 2023-07-15 VITALS — BP 127/82 | HR 75 | Temp 98.1°F | Ht 66.0 in | Wt 179.8 lb

## 2023-07-15 DIAGNOSIS — G4700 Insomnia, unspecified: Secondary | ICD-10-CM

## 2023-07-15 DIAGNOSIS — R1013 Epigastric pain: Secondary | ICD-10-CM

## 2023-07-15 MED ORDER — SUCRALFATE 1 G PO TABS: 1.0000 g | ORAL_TABLET | Freq: Three times a day (TID) | ORAL | 1 refills | Status: DC

## 2023-07-15 MED ORDER — LIDOCAINE 5 % EX PTCH: 1.0000 | MEDICATED_PATCH | CUTANEOUS | 0 refills | Status: AC

## 2023-07-15 MED ORDER — RAMELTEON 8 MG PO TABS: 8.0000 mg | ORAL_TABLET | Freq: Every day | ORAL | 1 refills | Status: DC

## 2023-07-15 NOTE — Patient Instructions (Signed)
 Medications as prescribed.  If abdominal issues continue please let me know so I can send you to GI.  Follow up in 3 months.

## 2023-07-16 ENCOUNTER — Other Ambulatory Visit: Payer: Self-pay | Admitting: Family Medicine

## 2023-07-16 ENCOUNTER — Telehealth: Payer: Self-pay | Admitting: *Deleted

## 2023-07-16 DIAGNOSIS — R1013 Epigastric pain: Secondary | ICD-10-CM | POA: Insufficient documentation

## 2023-07-16 MED ORDER — VALACYCLOVIR HCL 1 G PO TABS: 1000.0000 mg | ORAL_TABLET | Freq: Three times a day (TID) | ORAL | 0 refills | Status: AC

## 2023-07-16 NOTE — Progress Notes (Signed)
 Subjective:  Patient ID: Julie Campos, female    DOB: 28-Sep-1955  Age: 68 y.o. MRN: [id number]  CC: Nausea, abdominal pain, insomnia   HPI:  68 year old female presents for evaluation of the above.  Patient reports that for the past few months she has had intermittent abdominal pain and associated nausea.  Abdominal pain is located in the epigastric region.  Worse with eating and after eating.  Described as a burning sensation.  She reports associated decreased appetite.  Patient currently on Protonix and Pepcid.  Patient requesting refill of lidocaine patches which she uses for low back pain.  Additionally, patient reports continued insomnia.  This is a longstanding and chronic problem for her.  She has difficulty primarily with sleep onset but also has issues with sleep maintenance.  Has been on trazodone in the past and did not respond well.  Patient Active Problem List   Diagnosis Date Noted   Epigastric abdominal pain 07/16/2023   Lumbar radiculopathy 06/12/2022   GERD (gastroesophageal reflux disease) 12/31/2021   Pure hypercholesterolemia 12/31/2021   CAD (coronary artery disease) 09/11/2021   Vitamin D deficiency 10/02/2016   Obesity 11/27/2014   Insomnia 07/09/2012   Generalized anxiety disorder 07/09/2012   Depression 07/09/2012    Social Hx   Social History   Socioeconomic History   Marital status: Married    Spouse name: Not on file   Number of children: 1   Years of education: Not on file   Highest education level: Some college, no degree  Occupational History   Not on file  Tobacco Use   Smoking status: Never   Smokeless tobacco: Never  Substance and Sexual Activity   Alcohol use: No    Alcohol/week: 0.0 standard drinks of alcohol   Drug use: No   Sexual activity: Not Currently    Birth control/protection: Post-menopausal  Other Topics Concern   Not on file  Social History Narrative   Not on file   Social Drivers of Health   Financial  Resource Strain: Low Risk  (07/15/2023)   Overall Financial Resource Strain (CARDIA)    Difficulty of Paying Living Expenses: Not very hard  Food Insecurity: No Food Insecurity (07/15/2023)   Hunger Vital Sign    Worried About Running Out of Food in the Last Year: Never true    Ran Out of Food in the Last Year: Never true  Transportation Needs: No Transportation Needs (07/15/2023)   PRAPARE - Administrator, Civil Service (Medical): No    Lack of Transportation (Non-Medical): No  Physical Activity: Insufficiently Active (07/15/2023)   Exercise Vital Sign    Days of Exercise per Week: 1 day    Minutes of Exercise per Session: 30 min  Stress: Stress Concern Present (07/15/2023)   [employer] of Occupational Health - Occupational Stress Questionnaire    Feeling of Stress : Very much  Social Connections: Socially Integrated (07/15/2023)   Social Connection and Isolation Panel [NHANES]    Frequency of Communication with Friends and Family: More than three times a week    Frequency of Social Gatherings with Friends and Family: Once a week    Attends Religious Services: More than 4 times per year    Active Member of [employer] or Organizations: Yes    Attends Engineer, structural: More than 4 times per year    Marital Status: Married    Review of Systems Per HPI  Objective:  BP 127/82   Pulse 75  Temp 98.1 F (36.7 C)   Ht 5\' 6"  (1.676 m)   Wt 179 lb 12.8 oz (81.6 kg)   SpO2 98%   BMI 29.02 kg/m      07/15/2023    3:40 PM 01/28/2023    4:19 PM 12/09/2022    3:45 PM  BP/Weight  Systolic BP 127 118 108  Diastolic BP 82 76 74  Wt. (Lbs) 179.8 184.8 184  BMI 29.02 kg/m2 29.83 kg/m2 29.7 kg/m2    Physical Exam Vitals and nursing note reviewed.  Constitutional:      General: She is not in acute distress.    Appearance: Normal appearance.  HENT:     Head: Normocephalic and atraumatic.  Cardiovascular:     Rate and Rhythm: Normal rate and regular rhythm.   Pulmonary:     Effort: Pulmonary effort is normal.     Breath sounds: Normal breath sounds.  Abdominal:     Palpations: Abdomen is soft.     Comments: Mild tenderness in the epigastric region.  Neurological:     Mental Status: She is alert.     Lab Results  Component Value Date   WBC 5.5 12/31/2021   HGB 12.6 12/31/2021   HCT 39.0 12/31/2021   PLT 310 12/31/2021   GLUCOSE 87 12/31/2021   CHOL 210 (H) 12/31/2021   TRIG 94 12/31/2021   HDL 40 12/31/2021   LDLCALC 153 (H) 12/31/2021   ALT 12 12/31/2021   AST 13 12/31/2021   NA 140 12/31/2021   K 4.2 12/31/2021   CL 102 12/31/2021   CREATININE 0.81 12/31/2021   BUN 10 12/31/2021   CO2 25 12/31/2021   TSH 3.020 03/26/2022     Assessment & Plan:  Epigastric abdominal pain Assessment & Plan: New problem.  Associated nausea.  GERD versus gastritis.  Adding Carafate.  If continues to persist will refer to GI for EGD.   Insomnia, unspecified type Assessment & Plan: Chronic problem, uncontrolled.  Trial of ramelteon.   Other orders -     Sucralfate; Take 1 tablet (1 g total) by mouth 3 (three) times daily before meals.  Dispense: 120 tablet; Refill: 1 -     Ramelteon; Take 1 tablet (8 mg total) by mouth at bedtime.  Dispense: 30 tablet; Refill: 1 -     Lidocaine; Place 1 patch onto the skin daily. Remove & Discard patch within 12 hours or as directed by MD  Dispense: 30 patch; Refill: 0    Follow-up:  3 months  Arohi Salvatierra Adriana Simas DO [facility]

## 2023-07-16 NOTE — Assessment & Plan Note (Signed)
 Chronic problem, uncontrolled.  Trial of ramelteon.

## 2023-07-16 NOTE — Assessment & Plan Note (Signed)
 New problem.  Associated nausea.  GERD versus gastritis.  Adding Carafate.  If continues to persist will refer to GI for EGD.

## 2023-07-16 NOTE — Telephone Encounter (Addendum)
 PA attempted for Ramelteon- per insurance-  NON-FORMULARY DRUG  Also Lidocaine Patches not covered by insurance unless being used for acute shingles pain

## 2023-07-16 NOTE — Telephone Encounter (Signed)
 Tommie Sams, DO     Please inform patient.

## 2023-07-16 NOTE — Telephone Encounter (Unsigned)
 Copied from CRM [id number]. Topic: Clinical - Medication Refill >> Jul 16, 2023  4:07 PM Higinio Roger wrote: Most Recent Primary Care Visit:  Provider: Tommie Sams  Department: [facility]  Visit Type: ACUTE  Date: 07/15/2023  Medication: ondansetron (ZOFRAN-ODT) 4 MG disintegrating tablet   Has the patient contacted their pharmacy? Yes (Agent: If no, request that the patient contact the pharmacy for the refill. If patient does not wish to contact the pharmacy document the reason why and proceed with request.) (Agent: If yes, when and what did the pharmacy advise?)  Is this the correct pharmacy for this prescription? Yes If no, delete pharmacy and type the correct one.  This is the patient's preferred pharmacy:   [facility] [address] [place] - [address] [place] [address] Phone: [phone] Fax: [phone]   Has the prescription been filled recently? Yes  Is the patient out of the medication? Yes  Has the patient been seen for an appointment in the last year OR does the patient have an upcoming appointment? Yes  Can we respond through MyChart? Yes  Agent: Please be advised that Rx refills may take up to 3 business days. We ask that you follow-up with your pharmacy.

## 2023-07-17 ENCOUNTER — Other Ambulatory Visit: Payer: Self-pay

## 2023-07-17 MED ORDER — ONDANSETRON 4 MG PO TBDP: ORAL_TABLET | ORAL | 2 refills | Status: DC

## 2023-07-24 ENCOUNTER — Telehealth: Payer: Self-pay | Admitting: *Deleted

## 2023-07-24 ENCOUNTER — Other Ambulatory Visit: Payer: Self-pay | Admitting: Nurse Practitioner

## 2023-07-24 MED ORDER — ONDANSETRON 4 MG PO TBDP
ORAL_TABLET | ORAL | 2 refills | Status: DC
Start: 2023-07-24 — End: 2024-02-25

## 2023-07-24 NOTE — Telephone Encounter (Unsigned)
 Copied from CRM [id number]. Topic: Clinical - Medication Refill >> Jul 16, 2023  4:07 PM Higinio Roger wrote: Most Recent Primary Care Visit:  Provider: Tommie Sams  Department: [facility]  Visit Type: ACUTE  Date: 07/15/2023  Medication: ondansetron (ZOFRAN-ODT) 4 MG disintegrating tablet   Has the patient contacted their pharmacy? Yes (Agent: If no, request that the patient contact the pharmacy for the refill. If patient does not wish to contact the pharmacy document the reason why and proceed with request.) (Agent: If yes, when and what did the pharmacy advise?)  Is this the correct pharmacy for this prescription? Yes If no, delete pharmacy and type the correct one.  This is the patient's preferred pharmacy:   [facility] [address] [place] - [address] [place] [address] Phone: [phone] Fax: [phone]   Has the prescription been filled recently? Yes  Is the patient out of the medication? Yes  Has the patient been seen for an appointment in the last year OR does the patient have an upcoming appointment? Yes  Can we respond through MyChart? Yes  Agent: Please be advised that Rx refills may take up to 3 business days. We ask that you follow-up with your pharmacy.

## 2023-07-28 ENCOUNTER — Telehealth: Payer: Self-pay | Admitting: *Deleted

## 2023-07-28 NOTE — Telephone Encounter (Unsigned)
 Copied from CRM [id number]. Topic: Referral - Prior Authorization Question >> Jul 25, 2023  4:24 PM Leory Rands wrote: Reason for CRM: Patient is calling to report that a PA is needed for lidocaine (LIDODERM) 5 % [[id number]]. Patient is reporting that pain needs to be severed to be approved. Patient wants to know how does the insurance know how severe her pain is? Patient would like to know is there any medication that Dr. Debrah Fan can send send for sleep that would be approved by her insurance? Please advise

## 2023-07-29 ENCOUNTER — Other Ambulatory Visit: Payer: Self-pay | Admitting: Family Medicine

## 2023-07-29 NOTE — Telephone Encounter (Signed)
 Patient called returning missed call. Per notes provider called to let patient know that something different would be called in for sleeping. Patient also wanted an update on the patches. Called CAL - provider not available. Let patient know message from provider. Thank You

## 2023-07-29 NOTE — Telephone Encounter (Signed)
 Copied from CRM [id number]. Topic: Clinical - Medication Refill >> Jul 29, 2023  4:32 PM Crispin Dolphin wrote: Most Recent Primary Care Visit:  Provider: Myrna Ast  Department: [facility]  Visit Type: ACUTE  Date: 07/15/2023  Medication: clonazePAM (KLONOPIN) 0.5 MG tablet  Has the patient contacted their pharmacy? N/A (Agent: If no, request that the patient contact the pharmacy for the refill. If patient does not wish to contact the pharmacy document the reason why and proceed with request.) (Agent: If yes, when and what did the pharmacy advise?) Has not sent one for April - last received March 5th  Is this the correct pharmacy for this prescription? Yes If no, delete pharmacy and type the correct one.  This is the patient's preferred pharmacy:  [facility] - [place], [place] - [facility] AT Portal to Registered Caremark Sites [address] [place] [address] Phone: [phone] Fax: [phone]  Has the prescription been filled recently? No  Is the patient out of the medication? No  Has the patient been seen for an appointment in the last year OR does the patient have an upcoming appointment? Yes  Can we respond through MyChart? Yes  Agent: Please be advised that Rx refills may take up to 3 business days. We ask that you follow-up with your pharmacy.

## 2023-07-29 NOTE — Telephone Encounter (Signed)
 Left message to return call

## 2023-08-08 ENCOUNTER — Other Ambulatory Visit: Payer: Self-pay

## 2023-08-11 ENCOUNTER — Telehealth: Payer: Self-pay | Admitting: Pharmacy Technician

## 2023-08-11 ENCOUNTER — Ambulatory Visit
Admission: EM | Admit: 2023-08-11 | Discharge: 2023-08-11 | Disposition: A | Discharge status: Home / Self Care | Attending: Family Medicine | Admitting: Family Medicine

## 2023-08-11 ENCOUNTER — Other Ambulatory Visit (HOSPITAL_COMMUNITY): Payer: Self-pay

## 2023-08-11 DIAGNOSIS — S81851A Open bite, right lower leg, initial encounter: Secondary | ICD-10-CM | POA: Diagnosis not present

## 2023-08-11 DIAGNOSIS — S80811A Abrasion, right lower leg, initial encounter: Secondary | ICD-10-CM

## 2023-08-11 DIAGNOSIS — Z23 Encounter for immunization: Secondary | ICD-10-CM | POA: Diagnosis not present

## 2023-08-11 DIAGNOSIS — W540XXA Bitten by dog, initial encounter: Secondary | ICD-10-CM

## 2023-08-11 MED ORDER — AMOXICILLIN-POT CLAVULANATE 875-125 MG PO TABS: 1.0000 | ORAL_TABLET | Freq: Two times a day (BID) | ORAL | 0 refills | Status: DC

## 2023-08-11 MED ORDER — TETANUS-DIPHTH-ACELL PERTUSSIS 5-2.5-18.5 LF-MCG/0.5 IM SUSY
0.5000 mL | PREFILLED_SYRINGE | Freq: Once | INTRAMUSCULAR | Status: AC
  Administered 2023-08-11: 0.5 mL via INTRAMUSCULAR

## 2023-08-11 NOTE — Telephone Encounter (Signed)
 Pharmacy Patient Advocate Encounter  Received notification from [facility] that Prior Authorization for Lidocaine  5% patches has been DENIED.  Full denial letter will be uploaded to the media tab. See denial reason below.   PA #/Case ID/Reference #:  [id number]

## 2023-08-11 NOTE — Telephone Encounter (Addendum)
 Patient is aware

## 2023-08-11 NOTE — Discharge Instructions (Signed)
 Continue cleaning the area with soap and water and apply Neosporin or Vaseline and a patch bandage.  I have placed you on a course of antibiotics to keep the area from becoming infected.  We have also updated your tetanus shot today.  Follow-up for worsening symptoms at any time

## 2023-08-11 NOTE — ED Triage Notes (Signed)
 Pt states her neighbors dog bit her 3 days ago on her right leg.   Pt states her neighbor says the dog is up to date on rabies vaccinee. Pt would like to update her tdap if possible.

## 2023-08-11 NOTE — Telephone Encounter (Signed)
 Pharmacy Patient Advocate Encounter   Received notification from Onbase that prior authorization for Lidocaine  5% patches is required/requested.   Insurance verification completed.   The patient is insured through [facility] [place] [facility] .   Per test claim: PA required; PA submitted to above mentioned insurance via CoverMyMeds Key/confirmation #/EOC [facility] Status is pending

## 2023-08-12 ENCOUNTER — Other Ambulatory Visit: Payer: Self-pay | Admitting: Family Medicine

## 2023-08-15 NOTE — ED Provider Notes (Signed)
 [facility]    CSN: [id number] Arrival date & time: 08/11/23  1917      History   Chief Complaint Chief Complaint  Patient presents with   Animal Bite    HPI Julie Campos is a 68 y.o. female.   Patient presenting today with a dog bite to the right leg that occurred 3 days ago.  States the neighbors dog bit her and that the dog is up-to-date on rabies vaccines.  She has been keeping the area clean and covered but otherwise not tried anything over-the-counter for symptoms.  Denies bleeding, drainage, fevers, chills.  Requesting for Tdap to be updated today, unsure when her last one was.    Past Medical History:  Diagnosis Date   Allergy    Arthritis    Chronic back pain    Depression    Insomnia    Reflux     Patient Active Problem List   Diagnosis Date Noted   Epigastric abdominal pain 07/16/2023   Lumbar radiculopathy 06/12/2022   GERD (gastroesophageal reflux disease) 12/31/2021   Pure hypercholesterolemia 12/31/2021   CAD (coronary artery disease) 09/11/2021   Vitamin D  deficiency 10/02/2016   Obesity 11/27/2014   Insomnia 07/09/2012   Generalized anxiety disorder 07/09/2012   Depression 07/09/2012    Past Surgical History:  Procedure Laterality Date   NO PAST SURGERIES      OB History   No obstetric history on file.      Home Medications    Prior to Admission medications   Medication Sig Start Date End Date Taking? Authorizing Provider  amoxicillin -clavulanate (AUGMENTIN ) 875-125 MG tablet Take 1 tablet by mouth every 12 (twelve) hours. 08/11/23  Yes Corbin Dess, PA-C  aspirin  EC 81 MG tablet Take 1 tablet (81 mg total) by mouth daily. Swallow whole. 03/13/22  Yes BranchJoyceann No, MD  busPIRone  (BUSPAR ) 10 MG tablet TAKE 1 TABLET TWICE A DAY 03/10/23  Yes Cook, Jayce G, DO  famotidine  (PEPCID ) 20 MG tablet TAKE 1 TABLET AT BEDTIME 03/10/23  Yes Cook, Jayce G, DO  pantoprazole  (PROTONIX ) 40 MG tablet TAKE 1 TABLET  DAILY AS     NEEDED FOR ACID REFLUX 03/10/23  Yes Cook, Jayce G, DO  rosuvastatin  (CRESTOR ) 10 MG tablet TAKE 1 TABLET DAILY 03/10/23  Yes Cook, Jayce G, DO  sertraline  (ZOLOFT ) 100 MG tablet TAKE 1 AND 1/2 TABLETS(150 MG TOTAL) DAILY 03/10/23  Yes Cook, Jayce G, DO  sucralfate  (CARAFATE ) 1 g tablet Take 1 tablet (1 g total) by mouth 3 (three) times daily before meals. 07/15/23  Yes Cook, Jayce G, DO  valACYclovir  (VALTREX ) 1000 MG tablet Take 1 tablet (1,000 mg total) by mouth 3 (three) times daily. 07/16/23  Yes Cook, Jayce G, DO  cephALEXin  (KEFLEX ) 500 MG capsule Take 1 capsule (500 mg total) by mouth 2 (two) times daily. 01/28/23   Cook, Jayce G, DO  clobetasol (TEMOVATE) 0.05 % external solution  05/12/13   [provider]  clonazePAM  (KLONOPIN ) 0.5 MG tablet TAKE 1 TABLET BY MOUTH AT BEDTIME AS NEEDED FOR ANXIETY 08/13/23   Cook, Jayce G, DO  Ibuprofen-diphenhydrAMINE Cit (ADVIL PM PO) Take 1 tablet by mouth daily as needed.    [provider]  lidocaine  (LIDODERM ) 5 % Place 1 patch onto the skin daily. Remove & Discard patch within 12 hours or as directed by MD 07/15/23   Cook, Jayce G, DO  ondansetron  (ZOFRAN -ODT) 4 MG disintegrating tablet Take one tab po every  8 hours prn N/V 07/24/23   Michelle Aid C, NP  ramelteon  (ROZEREM ) 8 MG tablet Take 1 tablet (8 mg total) by mouth at bedtime. 07/15/23   Cook, Jayce G, DO    Family History Family History  Problem Relation Age of Onset   Osteoporosis Mother    Heart disease Father    Pulmonary fibrosis Father    Lung cancer Paternal Grandfather        smoked   Lung cancer Paternal Uncle        smoked   Breast cancer Paternal Aunt    Lung disease Paternal Grandmother    Lung cancer Paternal Aunt     Social History Social History   Tobacco Use   Smoking status: Never   Smokeless tobacco: Never  Vaping Use   Vaping status: Never Used  Substance Use Topics   Alcohol use: No    Alcohol/week: 0.0 standard drinks of  alcohol   Drug use: No     Allergies   Patient has no known allergies.   Review of Systems Review of Systems Per HPI  Physical Exam Triage Vital Signs ED Triage Vitals  Encounter Vitals Group     BP 08/11/23 1925 (!) 144/83     Systolic BP Percentile --      Diastolic BP Percentile --      Pulse Rate 08/11/23 1925 100     Resp 08/11/23 1925 16     Temp 08/11/23 1925 98.1 F (36.7 C)     Temp Source 08/11/23 1925 Oral     SpO2 08/11/23 1925 93 %     Weight --      Height --      Head Circumference --      Peak Flow --      Pain Score 08/11/23 1926 0     Pain Loc --      Pain Education --      Exclude from Growth Chart --    No data found.  Updated Vital Signs BP (!) 144/83 (BP Location: Right Arm)   Pulse 100   Temp 98.1 F (36.7 C) (Oral)   Resp 16   SpO2 93%   Visual Acuity Right Eye Distance:   Left Eye Distance:   Bilateral Distance:    Right Eye Near:   Left Eye Near:    Bilateral Near:     Physical Exam Vitals and nursing note reviewed.  Constitutional:      Appearance: Normal appearance. She is not ill-appearing.  HENT:     Head: Atraumatic.  Eyes:     Extraocular Movements: Extraocular movements intact.     Conjunctiva/sclera: Conjunctivae normal.  Cardiovascular:     Rate and Rhythm: Normal rate.  Pulmonary:     Effort: Pulmonary effort is normal.  Musculoskeletal:        General: Normal range of motion.     Cervical back: Normal range of motion and neck supple.  Skin:    General: Skin is warm.     Comments: Abrasions and bruising to the right lateral leg no foreign body appreciable and no bleeding or drainage  Neurological:     Mental Status: She is alert and oriented to person, place, and time.     Comments: Right lower extremity neurovascularly intact  Psychiatric:        Mood and Affect: Mood normal.        Thought Content: Thought content normal.  Judgment: Judgment normal.      UC Treatments / Results  Labs (all  labs ordered are listed, but only abnormal results are displayed) Labs Reviewed - No data to display  EKG   Radiology No results found.  Procedures Procedures (including critical care time)  Medications Ordered in UC Medications  Tdap (BOOSTRIX) injection 0.5 mL (0.5 mLs Intramuscular Given 08/11/23 1939)    Initial Impression / Assessment and Plan / UC Course  I have reviewed the triage vital signs and the nursing notes.  Pertinent labs & imaging results that were available during my care of the patient were reviewed by me and considered in my medical decision making (see chart for details).     Wounds cleaned and dressed today, Tdap updated, will start with Augmentin , good home wound care and discussed return precautions.  Dog rabies up-to-date per patient.  Final Clinical Impressions(s) / UC Diagnoses   Final diagnoses:  Abrasion of right lower extremity, initial encounter  Dog bite of right lower leg, initial encounter  Need for Tdap vaccination     Discharge Instructions      Continue cleaning the area with soap and water and apply Neosporin or Vaseline and a patch bandage.  I have placed you on a course of antibiotics to keep the area from becoming infected.  We have also updated your tetanus shot today.  Follow-up for worsening symptoms at any time    ED Prescriptions     Medication Sig Dispense Auth. Provider   amoxicillin -clavulanate (AUGMENTIN ) 875-125 MG tablet Take 1 tablet by mouth every 12 (twelve) hours. 14 tablet Corbin Dess, [place]      PDMP not reviewed this encounter.   Corbin Dess, [place] 08/15/23 1712

## 2023-09-16 ENCOUNTER — Ambulatory Visit: Admitting: Family Medicine

## 2023-09-17 ENCOUNTER — Ambulatory Visit: Admitting: Family Medicine

## 2023-09-17 VITALS — BP 120/66 | HR 88 | Temp 97.7°F | Ht 66.0 in | Wt 181.0 lb

## 2023-09-17 DIAGNOSIS — E7800 Pure hypercholesterolemia, unspecified: Secondary | ICD-10-CM | POA: Diagnosis not present

## 2023-09-17 DIAGNOSIS — G4700 Insomnia, unspecified: Secondary | ICD-10-CM | POA: Diagnosis not present

## 2023-09-17 DIAGNOSIS — R1013 Epigastric pain: Secondary | ICD-10-CM

## 2023-09-17 DIAGNOSIS — Z130 Encounter for screening for diseases of the blood and blood-forming organs and certain disorders involving the immune mechanism: Secondary | ICD-10-CM | POA: Diagnosis not present

## 2023-09-17 MED ORDER — SUCRALFATE 1 G PO TABS: 1.0000 g | ORAL_TABLET | Freq: Three times a day (TID) | ORAL | 1 refills | Status: DC

## 2023-09-17 MED ORDER — BELSOMRA 5 MG PO TABS: 5.0000 mg | ORAL_TABLET | Freq: Every evening | ORAL | 0 refills | Status: DC | PRN

## 2023-09-17 NOTE — Patient Instructions (Signed)
 Medications ordered.  Labs at your convenience.  Follow up in 6 months.

## 2023-09-18 ENCOUNTER — Ambulatory Visit: Payer: Self-pay | Admitting: Family Medicine

## 2023-09-18 LAB — CBC
Hematocrit: 37.3 % (ref 34.0–46.6)
Hemoglobin: 12 g/dL (ref 11.1–15.9)
MCH: 31.8 pg (ref 26.6–33.0)
MCHC: 32.2 g/dL (ref 31.5–35.7)
MCV: 99 fL — ABNORMAL HIGH (ref 79–97)
Platelets: 292 10*3/uL (ref 150–450)
RBC: 3.77 x10E6/uL (ref 3.77–5.28)
RDW: 12.4 % (ref 11.7–15.4)
WBC: 5.4 10*3/uL (ref 3.4–10.8)

## 2023-09-18 LAB — CMP14+EGFR
ALT: 12 IU/L (ref 0–32)
AST: 16 IU/L (ref 0–40)
Albumin: 4.4 g/dL (ref 3.9–4.9)
Alkaline Phosphatase: 118 IU/L (ref 44–121)
BUN/Creatinine Ratio: 18 (ref 12–28)
BUN: 14 mg/dL (ref 8–27)
Bilirubin Total: 0.4 mg/dL (ref 0.0–1.2)
CO2: 23 mmol/L (ref 20–29)
Calcium: 9.1 mg/dL (ref 8.7–10.3)
Chloride: 97 mmol/L (ref 96–106)
Creatinine, Ser: 0.79 mg/dL (ref 0.57–1.00)
Globulin, Total: 2.3 g/dL (ref 1.5–4.5)
Glucose: 83 mg/dL (ref 70–99)
Potassium: 4.2 mmol/L (ref 3.5–5.2)
Sodium: 135 mmol/L (ref 134–144)
Total Protein: 6.7 g/dL (ref 6.0–8.5)
eGFR: 82 mL/min/{1.73_m2} (ref >59)

## 2023-09-18 LAB — LIPID PANEL
Chol/HDL Ratio: 4.3 ratio (ref 0.0–4.4)
Cholesterol, Total: 184 mg/dL (ref 100–199)
HDL: 43 mg/dL (ref >39)
LDL Chol Calc (NIH): 122 mg/dL — ABNORMAL HIGH (ref 0–99)
Triglycerides: 107 mg/dL (ref 0–149)
VLDL Cholesterol Cal: 19 mg/dL (ref 5–40)

## 2023-09-18 NOTE — Assessment & Plan Note (Signed)
 Stable. Continue current medications. Carafate  refilled.

## 2023-09-18 NOTE — Progress Notes (Signed)
 Subjective:  Patient ID: Julie Campos, female    DOB: 04-Aug-1955  Age: 68 y.o. MRN: [id number]  CC:   Chief Complaint  Patient presents with   badominal pain follow up    Still some nausea and midline abdominal pain     HPI:  68 year old female presents for follow-up.  Patient's abdominal pain and associated nausea has improved but still occurs a couple of times a week.  She is on Protonix  and Carafate .  Needs refill on Carafate .  She is also taking Pepcid .  Patient continues to report issues with insomnia.  They have had long discussions about this previously.  A lot of this is due to the fact that she has excessive screen time.  She has tried trazodone , melatonin, and Ambien in the past.  Her insurance did not cover ramelteon .  Patient is interested in trying something else to help her sleep.  She states that she sleeps very little.    Patient Active Problem List   Diagnosis Date Noted   Epigastric abdominal pain 07/16/2023   Lumbar radiculopathy 06/12/2022   GERD (gastroesophageal reflux disease) 12/31/2021   Pure hypercholesterolemia 12/31/2021   CAD (coronary artery disease) 09/11/2021   Vitamin D  deficiency 10/02/2016   Obesity 11/27/2014   Insomnia 07/09/2012   Generalized anxiety disorder 07/09/2012   Depression 07/09/2012    Social Hx   Social History   Socioeconomic History   Marital status: Married    Spouse name: Not on file   Number of children: 1   Years of education: Not on file   Highest education level: Some college, no degree  Occupational History   Not on file  Tobacco Use   Smoking status: Never   Smokeless tobacco: Never  Vaping Use   Vaping status: Never Used  Substance and Sexual Activity   Alcohol use: No    Alcohol/week: 0.0 standard drinks of alcohol   Drug use: No   Sexual activity: Not Currently    Birth control/protection: Post-menopausal  Other Topics Concern   Not on file  Social History Narrative   Not on file    Social Drivers of Health   Financial Resource Strain: Low Risk  (07/15/2023)   Overall Financial Resource Strain (CARDIA)    Difficulty of Paying Living Expenses: Not very hard  Food Insecurity: No Food Insecurity (07/15/2023)   Hunger Vital Sign    Worried About Running Out of Food in the Last Year: Never true    Ran Out of Food in the Last Year: Never true  Transportation Needs: No Transportation Needs (07/15/2023)   PRAPARE - Administrator, Civil Service (Medical): No    Lack of Transportation (Non-Medical): No  Physical Activity: Insufficiently Active (07/15/2023)   Exercise Vital Sign    Days of Exercise per Week: 1 day    Minutes of Exercise per Session: 30 min  Stress: Stress Concern Present (07/15/2023)   [employer] of Occupational Health - Occupational Stress Questionnaire    Feeling of Stress : Very much  Social Connections: Socially Integrated (07/15/2023)   Social Connection and Isolation Panel [NHANES]    Frequency of Communication with Friends and Family: More than three times a week    Frequency of Social Gatherings with Friends and Family: Once a week    Attends Religious Services: More than 4 times per year    Active Member of [employer] or Organizations: Yes    Attends Banker Meetings: More than 4 times  per year    Marital Status: Married    Review of Systems Per HPI  Objective:  BP 120/66   Pulse 88   Temp 97.7 F (36.5 C)   Ht 5\' 6"  (1.676 m)   Wt 181 lb (82.1 kg)   SpO2 96%   BMI 29.21 kg/m      09/17/2023    3:34 PM 08/11/2023    7:25 PM 07/15/2023    3:40 PM  BP/Weight  Systolic BP 120 144 127  Diastolic BP 66 83 82  Wt. (Lbs) 181  179.8  BMI 29.21 kg/m2  29.02 kg/m2    Physical Exam Vitals and nursing note reviewed.  Constitutional:      General: She is not in acute distress.    Appearance: Normal appearance.  HENT:     Head: Normocephalic and atraumatic.  Cardiovascular:     Rate and Rhythm: Normal rate and  regular rhythm.  Pulmonary:     Effort: Pulmonary effort is normal.     Breath sounds: Normal breath sounds. No wheezing, rhonchi or rales.  Abdominal:     General: There is no distension.     Palpations: Abdomen is soft.     Tenderness: There is no abdominal tenderness.  Neurological:     Mental Status: She is alert.  Psychiatric:        Mood and Affect: Mood normal.        Behavior: Behavior normal.     Lab Results  Component Value Date   WBC 5.4 09/17/2023   HGB 12.0 09/17/2023   HCT 37.3 09/17/2023   PLT 292 09/17/2023   GLUCOSE 83 09/17/2023   CHOL 184 09/17/2023   TRIG 107 09/17/2023   HDL 43 09/17/2023   LDLCALC 122 (H) 09/17/2023   ALT 12 09/17/2023   AST 16 09/17/2023   NA 135 09/17/2023   K 4.2 09/17/2023   CL 97 09/17/2023   CREATININE 0.79 09/17/2023   BUN 14 09/17/2023   CO2 23 09/17/2023   TSH 3.020 03/26/2022     Assessment & Plan:  Epigastric abdominal pain Assessment & Plan: Stable. Continue current medications. Carafate  refilled.  Orders: -     Sucralfate ; Take 1 tablet (1 g total) by mouth 3 (three) times daily before meals.  Dispense: 120 tablet; Refill: 1 -     CMP14+EGFR  Pure hypercholesterolemia -     Lipid panel  Screening for deficiency anemia -     CBC  Insomnia, unspecified type Assessment & Plan: Chronic. Uncontrolled. Trial of Belsomra.  Orders: -     Belsomra; Take 1 tablet (5 mg total) by mouth at bedtime as needed.  Dispense: 30 tablet; Refill: 0    Follow-up:  6 months  Artemus Romanoff Debrah Fan DO [facility]

## 2023-09-18 NOTE — Assessment & Plan Note (Signed)
 Chronic. Uncontrolled. Trial of Belsomra.

## 2023-09-22 ENCOUNTER — Telehealth: Payer: Self-pay | Admitting: *Deleted

## 2023-09-22 ENCOUNTER — Other Ambulatory Visit: Payer: Self-pay | Admitting: Family Medicine

## 2023-09-22 NOTE — Telephone Encounter (Signed)
 Copied from CRM [id number]. Topic: Clinical - Medication Prior Auth >> Sep 22, 2023 10:24 AM Pennelope Bowler wrote: Reason for CRM:  prior authorization needed for Belsomra  5mg  Spoke to [place] from insurance Tawana Fast is the preferred alternative  PA number: [phone] if you'd like to continue with Belsomra 

## 2023-09-24 ENCOUNTER — Other Ambulatory Visit: Payer: Self-pay | Admitting: Family Medicine

## 2023-09-24 MED ORDER — DAYVIGO 5 MG PO TABS: 5.0000 mg | ORAL_TABLET | Freq: Every day | ORAL | 1 refills | Status: DC

## 2023-11-11 ENCOUNTER — Other Ambulatory Visit: Payer: Self-pay | Admitting: Family Medicine

## 2023-11-28 ENCOUNTER — Other Ambulatory Visit: Payer: Self-pay | Admitting: Family Medicine

## 2023-11-28 DIAGNOSIS — R1013 Epigastric pain: Secondary | ICD-10-CM

## 2023-11-28 NOTE — Telephone Encounter (Unsigned)
 Copied from CRM #[id number]. Topic: Clinical - Medication Refill >> Nov 28, 2023  4:33 PM Avram G wrote: Medication:  sucralfate (CARAFATE) 1 g tablet [[id number]] sertraline (ZOLOFT) 100 MG tablet [[id number]] busPIRone (BUSPAR) 10 MG tablet [[id number]] pantoprazole (PROTONIX) 40 MG tablet [[id number]] rosuvastatin (CRESTOR) 10 MG tablet [[id number]] famotidine (PEPCID) 20 MG tablet [[id number]]  clonazePAM (KLONOPIN) 0.5 MG tablet [[id number]]([id number] - [place], [place] - [address])   Has the patient contacted their pharmacy? Yes (Agent: If no, request that the patient contact the pharmacy for the refill. If patient does not wish to contact the pharmacy document the reason why and proceed with request.) (Agent: If yes, when and what did the pharmacy advise?)  This is the patient's preferred pharmacy:  [facility] - [place], [place] - [address] [facility] AT Portal to Registered Caremark Sites [address] [place] [address] Phone: [phone] Fax: [phone]  [facility] [address], [place] - [address] [place] [address] Phone: [phone] Fax: [phone] clonazePAM (KLONOPIN) 0.5 MG tablet [[id number]]  Is this the correct pharmacy for this prescription? Yes If no, delete pharmacy and type the correct one.   Has the prescription been filled recently? No  Is the patient out of the medication? No  Has the patient been seen for an appointment in the last year OR does the patient have an upcoming appointment? Yes  Can we respond through MyChart? Yes  Agent: Please be advised that Rx refills may take up to 3 business days. We ask that you follow-up with your pharmacy.

## 2023-12-01 ENCOUNTER — Other Ambulatory Visit: Payer: Self-pay

## 2023-12-01 DIAGNOSIS — R1013 Epigastric pain: Secondary | ICD-10-CM

## 2023-12-01 MED ORDER — FAMOTIDINE 20 MG PO TABS: ORAL_TABLET | ORAL | 3 refills | Status: DC

## 2023-12-01 MED ORDER — ROSUVASTATIN CALCIUM 10 MG PO TABS: 10.0000 mg | ORAL_TABLET | Freq: Every day | ORAL | 3 refills | Status: DC

## 2023-12-01 MED ORDER — PANTOPRAZOLE SODIUM 40 MG PO TBEC: DELAYED_RELEASE_TABLET | ORAL | 3 refills | Status: DC

## 2023-12-02 MED ORDER — SERTRALINE HCL 100 MG PO TABS: 100.0000 mg | ORAL_TABLET | Freq: Every day | ORAL | 3 refills | Status: DC

## 2023-12-02 MED ORDER — SUCRALFATE 1 G PO TABS: 1.0000 g | ORAL_TABLET | Freq: Three times a day (TID) | ORAL | 1 refills | Status: AC

## 2023-12-02 MED ORDER — BUSPIRONE HCL 10 MG PO TABS: 10.0000 mg | ORAL_TABLET | Freq: Two times a day (BID) | ORAL | 3 refills | Status: DC

## 2024-01-19 ENCOUNTER — Encounter: Payer: Self-pay | Admitting: Family Medicine

## 2024-01-19 NOTE — Telephone Encounter (Signed)
 Message was put in patient's chart

## 2024-02-10 ENCOUNTER — Other Ambulatory Visit: Payer: Self-pay | Admitting: Family Medicine

## 2024-02-20 ENCOUNTER — Other Ambulatory Visit: Payer: Self-pay | Admitting: Family Medicine

## 2024-02-25 ENCOUNTER — Encounter: Payer: Self-pay | Admitting: Family Medicine

## 2024-02-25 ENCOUNTER — Ambulatory Visit: Payer: Self-pay | Admitting: Family Medicine

## 2024-02-25 VITALS — BP 121/74 | HR 88 | Ht 66.0 in | Wt 180.0 lb

## 2024-02-25 DIAGNOSIS — R1013 Epigastric pain: Secondary | ICD-10-CM

## 2024-02-25 DIAGNOSIS — G4700 Insomnia, unspecified: Secondary | ICD-10-CM | POA: Diagnosis not present

## 2024-02-25 MED ORDER — TRAZODONE HCL 50 MG PO TABS: 25.0000 mg | ORAL_TABLET | Freq: Every evening | ORAL | 3 refills | Status: AC | PRN

## 2024-02-25 NOTE — Assessment & Plan Note (Signed)
 Chronic/uncontrolled. Patient wishes to try Trazodone  again. Medication sent in.  I feel that patient needs a formal sleep evaluation. Referring to Neurology.

## 2024-02-25 NOTE — Assessment & Plan Note (Signed)
 Persistent/refractory. Referring to GI.

## 2024-02-25 NOTE — Progress Notes (Signed)
 Subjective:  Patient ID: Julie Campos, female    DOB: 02-18-1956  Age: 68 y.o. MRN: [id number]  CC:   Chief Complaint  Patient presents with   Insomnia    Not being able to sleep at night    Nausea    Nausea     HPI:  68 year old female presents for evaluation of the above.  Chronic and long standing insomnia. We have tried multiple treatments >>> sleep hygiene, Trazodone , Ramelteon , Belsomra , Dayvigo . She has also tried ambien in the past as well as Melatonin. Continues to have difficult. Reports that she had some success with Trazodone .  Additionally, patient reports intermittent epigastric abdominal pain and nausea. This is chronic as well. She is on Carafate , PPI, and H2 blocker.  Patient Active Problem List   Diagnosis Date Noted   Epigastric abdominal pain 07/16/2023   Lumbar radiculopathy 06/12/2022   GERD (gastroesophageal reflux disease) 12/31/2021   Pure hypercholesterolemia 12/31/2021   CAD (coronary artery disease) 09/11/2021   Vitamin D  deficiency 10/02/2016   Obesity 11/27/2014   Insomnia 07/09/2012   Generalized anxiety disorder 07/09/2012   Depression 07/09/2012    Social Hx   Social History   Socioeconomic History   Marital status: Married    Spouse name: Not on file   Number of children: 1   Years of education: Not on file   Highest education level: Some college, no degree  Occupational History   Not on file  Tobacco Use   Smoking status: Never   Smokeless tobacco: Never  Vaping Use   Vaping status: Never Used  Substance and Sexual Activity   Alcohol use: No    Alcohol/week: 0.0 standard drinks of alcohol   Drug use: No   Sexual activity: Not Currently    Birth control/protection: Post-menopausal  Other Topics Concern   Not on file  Social History Narrative   Not on file   Social Drivers of Health   Financial Resource Strain: Low Risk  (02/25/2024)   Overall Financial Resource Strain (CARDIA)    Difficulty of Paying Living  Expenses: Not hard at all  Food Insecurity: No Food Insecurity (02/25/2024)   Hunger Vital Sign    Worried About Running Out of Food in the Last Year: Never true    Ran Out of Food in the Last Year: Never true  Transportation Needs: No Transportation Needs (02/25/2024)   PRAPARE - Administrator, Civil Service (Medical): No    Lack of Transportation (Non-Medical): No  Physical Activity: Insufficiently Active (02/25/2024)   Exercise Vital Sign    Days of Exercise per Week: 2 days    Minutes of Exercise per Session: 40 min  Stress: Stress Concern Present (02/25/2024)   [employer] of Occupational Health - Occupational Stress Questionnaire    Feeling of Stress: Very much  Social Connections: Socially Integrated (02/25/2024)   Social Connection and Isolation Panel    Frequency of Communication with Friends and Family: More than three times a week    Frequency of Social Gatherings with Friends and Family: Twice a week    Attends Religious Services: More than 4 times per year    Active Member of [employer] or Organizations: Yes    Attends Banker Meetings: 1 to 4 times per year    Marital Status: Married    Review of Systems Per HPI  Objective:  BP 121/74   Pulse 88   Ht 5' 6 (1.676 m)   Wt  180 lb (81.6 kg)   SpO2 98%   BMI 29.05 kg/m      02/25/2024    4:21 PM 09/17/2023    3:34 PM 08/11/2023    7:25 PM  BP/Weight  Systolic BP 121 120 144  Diastolic BP 74 66 83  Wt. (Lbs) 180 181   BMI 29.05 kg/m2 29.21 kg/m2     Physical Exam Vitals and nursing note reviewed.  Constitutional:      General: She is not in acute distress.    Appearance: Normal appearance.  HENT:     Head: Normocephalic and atraumatic.  Cardiovascular:     Rate and Rhythm: Normal rate and regular rhythm.  Pulmonary:     Effort: Pulmonary effort is normal.     Breath sounds: Normal breath sounds. No wheezing or rales.  Abdominal:     General: There is no distension.      Palpations: Abdomen is soft.     Tenderness: There is no abdominal tenderness.  Neurological:     Mental Status: She is alert.     Lab Results  Component Value Date   WBC 5.4 09/17/2023   HGB 12.0 09/17/2023   HCT 37.3 09/17/2023   PLT 292 09/17/2023   GLUCOSE 83 09/17/2023   CHOL 184 09/17/2023   TRIG 107 09/17/2023   HDL 43 09/17/2023   LDLCALC 122 (H) 09/17/2023   ALT 12 09/17/2023   AST 16 09/17/2023   NA 135 09/17/2023   K 4.2 09/17/2023   CL 97 09/17/2023   CREATININE 0.79 09/17/2023   BUN 14 09/17/2023   CO2 23 09/17/2023   TSH 3.020 03/26/2022     Assessment & Plan:  Insomnia, unspecified type Assessment & Plan: Chronic/uncontrolled. Patient wishes to try Trazodone  again. Medication sent in.  I feel that patient needs a formal sleep evaluation. Referring to Neurology.  Orders: -     Ambulatory referral to Neurology -     traZODone  HCl; Take 0.5-1 tablets (25-50 mg total) by mouth at bedtime as needed for sleep.  Dispense: 30 tablet; Refill: 3  Epigastric abdominal pain Assessment & Plan: Persistent/refractory. Referring to GI.  Orders: -     Ambulatory referral to Gastroenterology   Jacqulyn Ahle DO [facility]

## 2024-03-10 ENCOUNTER — Encounter: Payer: Self-pay | Admitting: Internal Medicine

## 2024-03-17 ENCOUNTER — Ambulatory Visit: Admitting: Family Medicine

## 2024-04-15 ENCOUNTER — Other Ambulatory Visit: Payer: Self-pay | Admitting: Family Medicine

## 2024-05-04 ENCOUNTER — Encounter: Payer: Self-pay | Admitting: *Deleted

## 2024-05-04 ENCOUNTER — Encounter: Payer: Self-pay | Admitting: Gastroenterology

## 2024-05-04 ENCOUNTER — Ambulatory Visit: Admitting: Gastroenterology

## 2024-05-04 VITALS — BP 119/72 | HR 84 | Temp 98.4°F | Ht 66.0 in | Wt 172.8 lb

## 2024-05-04 DIAGNOSIS — K5900 Constipation, unspecified: Secondary | ICD-10-CM

## 2024-05-04 DIAGNOSIS — R110 Nausea: Secondary | ICD-10-CM

## 2024-05-04 DIAGNOSIS — R1013 Epigastric pain: Secondary | ICD-10-CM | POA: Diagnosis not present

## 2024-05-04 DIAGNOSIS — K219 Gastro-esophageal reflux disease without esophagitis: Secondary | ICD-10-CM

## 2024-05-04 DIAGNOSIS — Z800 Family history of malignant neoplasm of digestive organs: Secondary | ICD-10-CM | POA: Diagnosis not present

## 2024-05-04 NOTE — Progress Notes (Signed)
 "    GI Office Note    Referring Provider: Cook, Jayce G, DO Primary Care Physician:  Cook, Jayce G, DO  Primary Gastroenterologist:  Chief Complaint   Chief Complaint  Patient presents with   New Patient (Initial Visit)    Pt referred for abd pain X a year. Pt has nausea a lot along with it     History of Present Illness   Julie Campos is a 69 y.o. female presenting today at the request of Dr. Bluford for further evaluation of chronic epigastric pain and nausea.   Discussed the use of AI scribe software for clinical note transcription with the patient, who gave verbal consent to proceed.  History of Present Illness Julie Campos is a 69 year old female with gastroesophageal reflux disease who presents with chronic epigastric pain, nausea.  For nearly a year she has had persistent nausea and upper abdominal pain, usually after eating. Pain is localized to the epigastrium. Fried foods and stress worsen symptoms. She has unintentionally lost about 10 pounds since December and often finds food unappealing.  Heartburn and reflux were prominent early on but are now minimal. She has taken pantoprazole  and famotidine  daily for 6-8 months, with sucralfate  providing additional relief. She previously needed frequent Alka-Seltzer and Pepto-Bismol but no longer uses them daily. Denies esophageal dysphagia.  Over the past year her bowel movements are less frequent, with hard, pellet-like stools (Bristol type 1) that are darker but without visible blood. She has not used laxatives. She intermittently notes a hard spot in the left lower abdomen.   For sleep and pain she uses Advil PM most nights for back pain and a recent left toe fracture, and uses trazodone  on nights she does not take Advil PM. She has avoided BC and Goody's powders for about a year.  She has never had a colonoscopy. A Cologuard kit is at home but not completed. Family history is notable for colon cancer in a paternal  aunt and peptic/upper GI disease (stomach trouble, ulcers, esophagitis) in her father. Alcohol use is rare and limited to special occasions.   Wt Readings from Last 5 Encounters:  05/04/24 172 lb 12.8 oz (78.4 kg)  02/25/24 180 lb (81.6 kg)  09/17/23 181 lb (82.1 kg)  07/15/23 179 lb 12.8 oz (81.6 kg)  01/28/23 184 lb 12.8 oz (83.8 kg)      Prior Data   Results      Latest Ref Rng & Units 09/17/2023    4:27 PM 12/31/2021    3:13 PM 09/11/2021    3:56 PM  CBC  WBC 3.4 - 10.8 x10E3/uL 5.4  5.5  5.5   Hemoglobin 11.1 - 15.9 g/dL 87.9  87.3  87.2   Hematocrit 34.0 - 46.6 % 37.3  39.0  38.5   Platelets 150 - 450 x10E3/uL 292  310  309       Latest Ref Rng & Units 09/17/2023    4:27 PM 12/31/2021    3:13 PM 09/11/2021    3:56 PM  BMP  Glucose 70 - 99 mg/dL 83  87  94   BUN 8 - 27 mg/dL 14  10  9    Creatinine 0.57 - 1.00 mg/dL 9.20  9.18  9.19   BUN/Creat Ratio 12 - 28 18  12  11    Sodium 134 - 144 mmol/L 135  140  139   Potassium 3.5 - 5.2 mmol/L 4.2  4.2  4.4   Chloride 96 -  106 mmol/L 97  102  100   CO2 20 - 29 mmol/L 23  25  23    Calcium  8.7 - 10.3 mg/dL 9.1  9.4  9.7       Latest Ref Rng & Units 09/17/2023    4:27 PM 12/31/2021    3:13 PM 09/11/2021    3:56 PM  Hepatic Function  Total Protein 6.0 - 8.5 g/dL 6.7  7.2  7.1   Albumin 3.9 - 4.9 g/dL 4.4  4.3  4.5   AST 0 - 40 IU/L 16  13  16    ALT 0 - 32 IU/L 12  12  13    Alk Phosphatase 44 - 121 IU/L 118  114  115   Total Bilirubin 0.0 - 1.2 mg/dL 0.4  0.4  0.3    Lab Results  Component Value Date   TSH 3.020 03/26/2022    Medications   Current Outpatient Medications  Medication Sig Dispense Refill   aspirin  EC 81 MG tablet Take 1 tablet (81 mg total) by mouth daily. Swallow whole. 90 tablet 1   busPIRone  (BUSPAR ) 10 MG tablet TAKE 1 TABLET TWICE A DAY 180 tablet 3   clobetasol (TEMOVATE) 0.05 % external solution      clonazePAM  (KLONOPIN ) 0.5 MG tablet TAKE 1 TABLET BY MOUTH AT BEDTIME AS NEEDED FOR ANXIETY 90  tablet 0   famotidine  (PEPCID ) 20 MG tablet TAKE 1 TABLET AT BEDTIME 90 tablet 3   Ibuprofen-diphenhydrAMINE Cit (ADVIL PM PO) Take 1 tablet by mouth daily as needed.     lidocaine  (LIDODERM ) 5 % Place 1 patch onto the skin daily. Remove & Discard patch within 12 hours or as directed by MD 30 patch 0   pantoprazole  (PROTONIX ) 40 MG tablet TAKE 1 TABLET DAILY AS     NEEDED FOR ACID REFLUX 90 tablet 3   rosuvastatin  (CRESTOR ) 10 MG tablet TAKE 1 TABLET DAILY 90 tablet 3   sertraline  (ZOLOFT ) 100 MG tablet TAKE 1 AND 1/2 TABLETS(150 MG TOTAL) DAILY 135 tablet 3   sucralfate  (CARAFATE ) 1 g tablet Take 1 tablet (1 g total) by mouth 3 (three) times daily before meals. 90 tablet 1   traZODone  (DESYREL ) 50 MG tablet Take 0.5-1 tablets (25-50 mg total) by mouth at bedtime as needed for sleep. 30 tablet 3   valACYclovir  (VALTREX ) 1000 MG tablet Take 1 tablet (1,000 mg total) by mouth 3 (three) times daily. 30 tablet 0   No current facility-administered medications for this visit.    Allergies   Allergies as of 05/04/2024   (No Known Allergies)    Past Medical History   Past Medical History:  Diagnosis Date   Allergy    Arthritis    Chronic back pain    Depression    Insomnia    Reflux     Past Surgical History   Past Surgical History:  Procedure Laterality Date   NO PAST SURGERIES      Past Family History   Family History  Problem Relation Age of Onset   Osteoporosis Mother    Heart disease Father    Pulmonary fibrosis Father    Heart attack Father    Lung disease Paternal Grandmother    Lung cancer Paternal Grandfather        smoked   Breast cancer Paternal Aunt    Lung cancer Paternal Aunt    Colon cancer Paternal Aunt    Lung cancer Paternal Uncle  smoked    Past Social History   Social History   Socioeconomic History   Marital status: Married    Spouse name: Not on file   Number of children: 1   Years of education: Not on file   Highest education  level: Some college, no degree  Occupational History   Not on file  Tobacco Use   Smoking status: Never   Smokeless tobacco: Never  Vaping Use   Vaping status: Never Used  Substance and Sexual Activity   Alcohol use: No    Alcohol/week: 0.0 standard drinks of alcohol   Drug use: No   Sexual activity: Not Currently    Birth control/protection: Post-menopausal  Other Topics Concern   Not on file  Social History Narrative   Not on file   Social Drivers of Health   Tobacco Use: Low Risk (05/04/2024)   Patient History    Smoking Tobacco Use: Never    Smokeless Tobacco Use: Never    Passive Exposure: Not on file  Financial Resource Strain: Low Risk (02/25/2024)   Overall Financial Resource Strain (CARDIA)    Difficulty of Paying Living Expenses: Not hard at all  Food Insecurity: No Food Insecurity (02/25/2024)   Epic    Worried About Radiation Protection Practitioner of Food in the Last Year: Never true    Ran Out of Food in the Last Year: Never true  Transportation Needs: No Transportation Needs (02/25/2024)   Epic    Lack of Transportation (Medical): No    Lack of Transportation (Non-Medical): No  Physical Activity: Insufficiently Active (02/25/2024)   Exercise Vital Sign    Days of Exercise per Week: 2 days    Minutes of Exercise per Session: 40 min  Stress: Stress Concern Present (02/25/2024)   [employer] of Occupational Health - Occupational Stress Questionnaire    Feeling of Stress: Very much  Social Connections: Socially Integrated (02/25/2024)   Social Connection and Isolation Panel    Frequency of Communication with Friends and Family: More than three times a week    Frequency of Social Gatherings with Friends and Family: Twice a week    Attends Religious Services: More than 4 times per year    Active Member of [employer] or Organizations: Yes    Attends Banker Meetings: 1 to 4 times per year    Marital Status: Married  Catering Manager Violence: Not on file   Depression (PHQ2-9): High Risk (02/25/2024)   Depression (PHQ2-9)    PHQ-2 Score: 11  Alcohol Screen: Not on file  Housing: Low Risk (02/25/2024)   Epic    Unable to Pay for Housing in the Last Year: No    Number of Times Moved in the Last Year: 0    Homeless in the Last Year: No  Utilities: Not on file  Health Literacy: Not on file    Review of Systems   General: Negative for anorexia, weight loss, fever, chills, fatigue, weakness. Eyes: Negative for vision changes.  ENT: Negative for hoarseness, difficulty swallowing , nasal congestion. CV: Negative for chest pain, angina, palpitations, dyspnea on exertion, peripheral edema.  Respiratory: Negative for dyspnea at rest, dyspnea on exertion, cough, sputum, wheezing.  GI: See history of present illness. GU:  Negative for dysuria, hematuria, urinary incontinence, urinary frequency, nocturnal urination.  MS: Negative for joint pain. + low back pain.  Derm: Negative for rash or itching.  Neuro: Negative for weakness, abnormal sensation, seizure, frequent headaches, memory loss,  confusion. insomnia Psych: Negative  for anxiety, depression, suicidal ideation, hallucinations.  Endo: Negative for unusual weight change.  Heme: Negative for bruising or bleeding. Allergy: Negative for rash or hives.  Physical Exam   BP 119/72   Pulse 84   Temp 98.4 F (36.9 C)   Ht 5' 6 (1.676 m)   Wt 172 lb 12.8 oz (78.4 kg)   BMI 27.89 kg/m    General: Well-nourished, well-developed in no acute distress.  Head: Normocephalic, atraumatic.   Eyes: Conjunctiva pink, no icterus. Mouth: Oropharyngeal mucosa moist and pink  Neck: Supple without thyromegaly, masses, or lymphadenopathy.  Lungs: Clear to auscultation bilaterally.  Heart: Regular rate and rhythm, no murmurs rubs or gallops.  Abdomen: Bowel sounds are normal, nontender, nondistended, no hepatosplenomegaly or masses,  no abdominal bruits or hernia, no rebound or guarding.   Rectal:  not performed Extremities: No lower extremity edema. No clubbing or deformities.  Neuro: Alert and oriented x 4 , grossly normal neurologically.  Skin: Warm and dry, no rash or jaundice.   Psych: Alert and cooperative, normal mood and affect.  Labs   See above  Imaging Studies   No results found.  Assessment/Plan:    Assessment & Plan Chronic epigastric pain and nausea Intermittent symptoms for nearly a year with partial response to treatment. Weight loss of 10 pounds more recently with decreased appetite. Stress may be playing a role. - Scheduled upper endoscopy to evaluate for gastritis, peptic ulcer disease, or other upper GI pathology. ASA 2.  I have discussed the risks, alternatives, benefits with regards to but not limited to the risk of reaction to medication, bleeding, infection, perforation and the patient is agreeable to proceed. Written consent to be obtained. - Continued sucralfate , pantoprazole , and famotidine .  Gastroesophageal reflux disease Typical symptoms well controlled on current medication regimen. - Continued pantoprazole  and famotidine  as previously prescribed.  Constipation Recent change in bowel habits with decreased frequency and harder stools.  -miralax 17 grams twice daily until soft stool, then continue once daily as needed  Colorectal cancer screening No prior colonoscopy or Cologuard. Paternal aunt with colon cancer but no first degree relatives. She has Cologuard at home that she never completed. We discussed colonoscopy at time of upper endoscopy but patient not ready to commit. She will go home and think about what she wants to do.  - Discussed options for colorectal cancer screening, including colonoscopy and Cologuard. - Deferred scheduling colonoscopy at this time per her preference; will proceed with upper endoscopy first. - Advised her to notify the clinic if she decides to pursue colonoscopy or complete Cologuard.     Sonny RAMAN. Ezzard, MHS,  PA-C [facility]  "

## 2024-05-04 NOTE — Patient Instructions (Addendum)
 Continue famotidine , pantoprazole , and sucralfate  for now.  Upper endoscopy to be scheduled.   Think about colon cancer screening. If you decide you want to have colonoscopy done at the same time of upper endoscopy, let me know if the next few days. Otherwise, we can do it at a later date or you can complete cologuard at any time.  For constipation, please add miralax one capful twice daily until soft stool, then continue once daily as needed to maintain soft stool.

## 2024-05-06 ENCOUNTER — Encounter: Payer: Self-pay | Admitting: *Deleted

## 2024-05-06 ENCOUNTER — Telehealth: Payer: Self-pay | Admitting: *Deleted

## 2024-05-06 MED ORDER — PEG 3350-KCL-NA BICARB-NACL 420 G PO SOLR: 4000.0000 mL | Freq: Once | ORAL | 0 refills | Status: AC

## 2024-05-06 NOTE — Telephone Encounter (Signed)
 Pt left vm asking if she could go ahead and have colonoscopy with EGD. Please advise. Thank you

## 2024-05-06 NOTE — Addendum Note (Signed)
 Addended by: GAYLENE MADELIN CROME on: 05/06/2024 04:35 PM   Modules accepted: Orders

## 2024-05-06 NOTE — Telephone Encounter (Signed)
 Pt informed that colonoscopy has been added to EGD. Will send updated instructions via MyChart. Prep sent to pharmacy.

## 2024-05-06 NOTE — Telephone Encounter (Signed)
 LMOVM to return call.

## 2024-05-06 NOTE — Telephone Encounter (Signed)
 Please add colonoscopy for screening. Remind her to use miralax as instructed day of ov to get bms moving better. Given bisacodyl 10mg  daily for 3 days before her bowel prep.

## 2024-05-13 ENCOUNTER — Other Ambulatory Visit: Payer: Self-pay

## 2024-05-13 ENCOUNTER — Encounter (HOSPITAL_COMMUNITY)
Admission: RE | Admit: 2024-05-13 | Discharge: 2024-05-13 | Disposition: A | Discharge status: Home / Self Care | Source: Ambulatory Visit | Attending: Internal Medicine | Admitting: Internal Medicine

## 2024-05-13 ENCOUNTER — Encounter (HOSPITAL_COMMUNITY): Payer: Self-pay

## 2024-05-14 ENCOUNTER — Telehealth: Payer: Self-pay | Admitting: *Deleted

## 2024-05-14 NOTE — Telephone Encounter (Signed)
 LMOVM to call back to reschedule procedure for Tuesday with Dr. Cindie TCS/EGD asa 2

## 2024-05-18 ENCOUNTER — Encounter (HOSPITAL_COMMUNITY): Admission: RE | Payer: Self-pay | Source: Home / Self Care

## 2024-05-18 ENCOUNTER — Ambulatory Visit (HOSPITAL_COMMUNITY): Admission: RE | Admit: 2024-05-18 | Source: Home / Self Care | Admitting: Internal Medicine

## 2024-07-02 ENCOUNTER — Ambulatory Visit
